# Patient Record
Sex: Female | Born: 1980 | Race: Black or African American | Hispanic: No | Marital: Single | Smoking: Current every day smoker
Health system: Southern US, Community
[De-identification: ages and names within clinical notes are randomized; demographics above are authoritative.]

## PROBLEM LIST (undated history)

## (undated) DIAGNOSIS — I10 Essential (primary) hypertension: Secondary | ICD-10-CM

## (undated) DIAGNOSIS — IMO0002 Reserved for concepts with insufficient information to code with codable children: Secondary | ICD-10-CM

## (undated) DIAGNOSIS — G4730 Sleep apnea, unspecified: Secondary | ICD-10-CM

## (undated) HISTORY — DX: Reserved for concepts with insufficient information to code with codable children: IMO0002

## (undated) HISTORY — DX: Sleep apnea, unspecified: G47.30

## (undated) HISTORY — PX: INNER EAR SURGERY: SHX679

## (undated) HISTORY — PX: LAPAROSCOPIC GASTRIC SLEEVE RESECTION: SHX5895

## (undated) HISTORY — PX: TUBAL LIGATION: SHX77

---

## 2007-01-28 ENCOUNTER — Ambulatory Visit: Payer: Self-pay | Admitting: Obstetrics & Gynecology

## 2007-01-28 ENCOUNTER — Ambulatory Visit (HOSPITAL_COMMUNITY): Admission: RE | Admit: 2007-01-28 | Discharge: 2007-01-28 | Payer: Self-pay | Admitting: Obstetrics and Gynecology

## 2007-01-28 ENCOUNTER — Encounter: Payer: Self-pay | Admitting: Obstetrics and Gynecology

## 2007-02-04 ENCOUNTER — Ambulatory Visit: Payer: Self-pay | Admitting: Obstetrics & Gynecology

## 2007-02-05 ENCOUNTER — Ambulatory Visit: Payer: Self-pay | Admitting: Obstetrics and Gynecology

## 2007-02-12 ENCOUNTER — Ambulatory Visit: Payer: Self-pay | Admitting: Gynecology

## 2007-02-19 ENCOUNTER — Ambulatory Visit: Payer: Self-pay | Admitting: Family Medicine

## 2007-02-24 ENCOUNTER — Ambulatory Visit (HOSPITAL_COMMUNITY): Admission: RE | Admit: 2007-02-24 | Discharge: 2007-02-24 | Payer: Self-pay | Admitting: Obstetrics & Gynecology

## 2007-03-05 ENCOUNTER — Ambulatory Visit: Payer: Self-pay | Admitting: Gynecology

## 2007-03-12 ENCOUNTER — Ambulatory Visit: Payer: Self-pay | Admitting: Family Medicine

## 2007-03-19 ENCOUNTER — Ambulatory Visit: Payer: Self-pay | Admitting: Family Medicine

## 2007-03-26 ENCOUNTER — Ambulatory Visit: Payer: Self-pay | Admitting: Family Medicine

## 2007-03-28 ENCOUNTER — Inpatient Hospital Stay (HOSPITAL_COMMUNITY): Admission: AD | Admit: 2007-03-28 | Discharge: 2007-03-30 | Payer: Self-pay | Admitting: Obstetrics and Gynecology

## 2007-03-28 ENCOUNTER — Ambulatory Visit: Payer: Self-pay | Admitting: Obstetrics & Gynecology

## 2008-01-05 ENCOUNTER — Emergency Department (HOSPITAL_COMMUNITY): Admission: EM | Admit: 2008-01-05 | Discharge: 2008-01-05 | Payer: Self-pay | Admitting: Emergency Medicine

## 2008-08-05 ENCOUNTER — Emergency Department (HOSPITAL_COMMUNITY): Admission: EM | Admit: 2008-08-05 | Discharge: 2008-08-05 | Payer: Self-pay | Admitting: Emergency Medicine

## 2008-08-19 DIAGNOSIS — R87619 Unspecified abnormal cytological findings in specimens from cervix uteri: Secondary | ICD-10-CM

## 2008-08-19 DIAGNOSIS — IMO0002 Reserved for concepts with insufficient information to code with codable children: Secondary | ICD-10-CM

## 2008-08-19 HISTORY — DX: Reserved for concepts with insufficient information to code with codable children: IMO0002

## 2008-08-19 HISTORY — DX: Unspecified abnormal cytological findings in specimens from cervix uteri: R87.619

## 2008-12-21 ENCOUNTER — Other Ambulatory Visit: Admission: RE | Admit: 2008-12-21 | Discharge: 2008-12-21 | Payer: Self-pay | Admitting: Family Medicine

## 2009-05-23 IMAGING — US US OB COMP +14 WK
1 series · 14 of 28 positions shown · non-contrast
Comparison: none

OBSTETRICAL ULTRASOUND:

 This ultrasound exam was performed in the [HOSPITAL] Ultrasound Department.  The OB US report was generated in the AS system, and faxed to the ordering physician.  This report is also available in [REDACTED] PACS.

[Series 1: us ob comp +14 wk · 0.28mm/px · 14 of 58 slices shown]
[im 3/58]
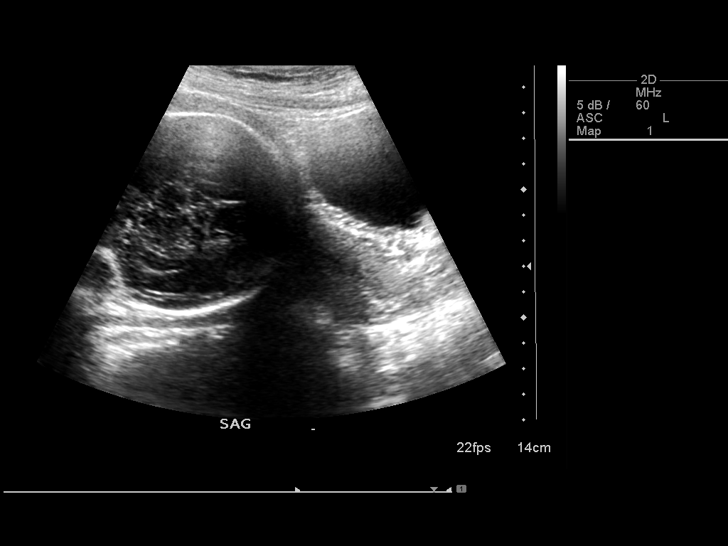
[im 7/58]
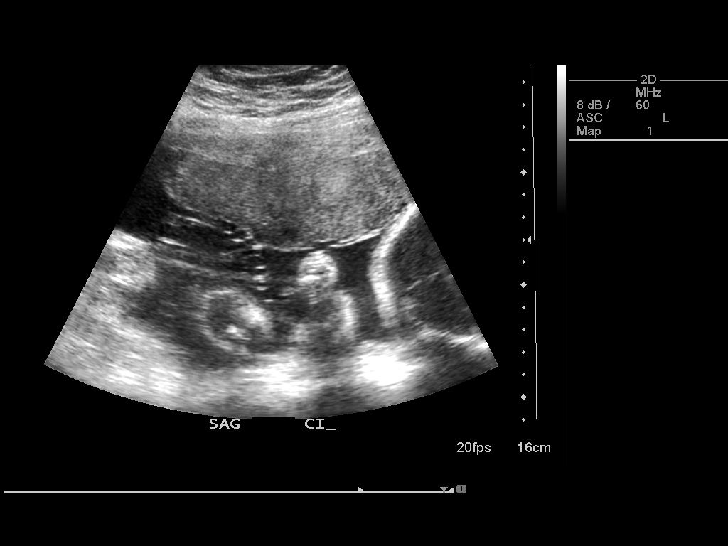
[im 11/58]
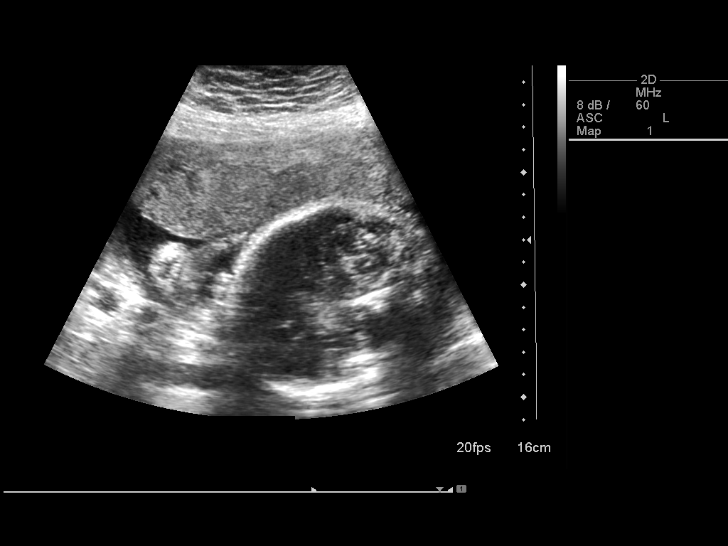
[im 15/58]
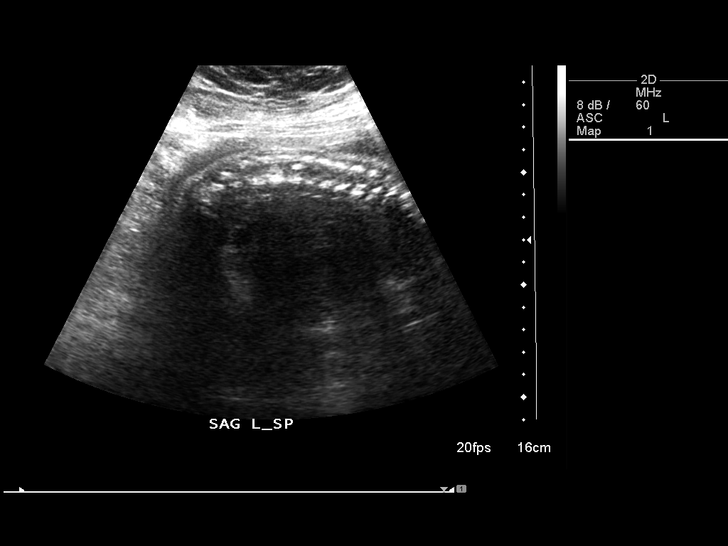
[im 20/58]
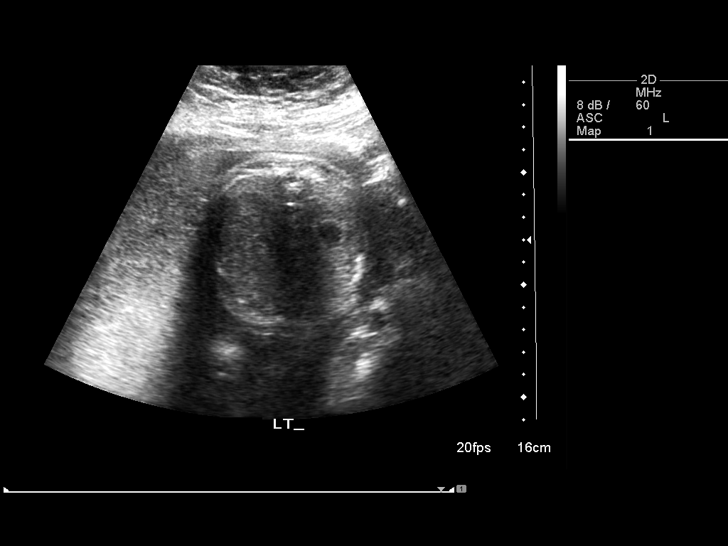
[im 24/58]
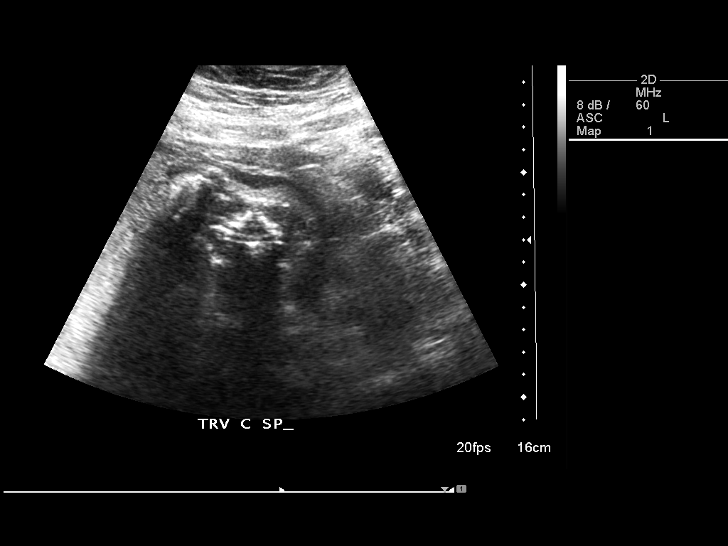
[im 28/58]
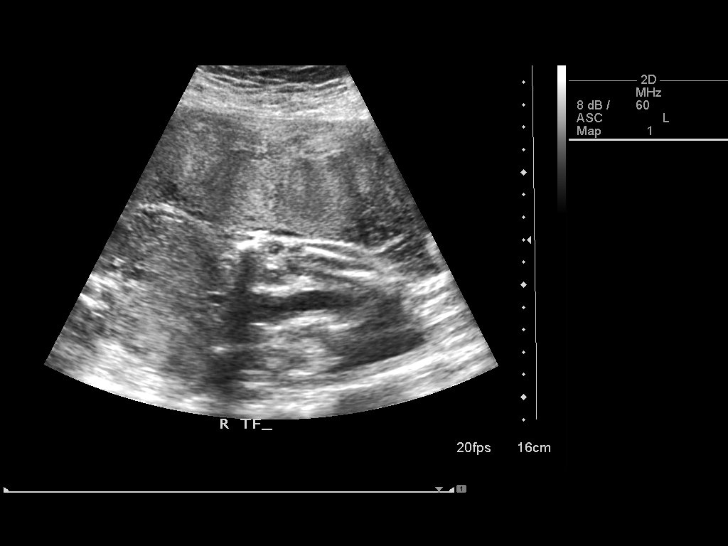
[im 32/58]
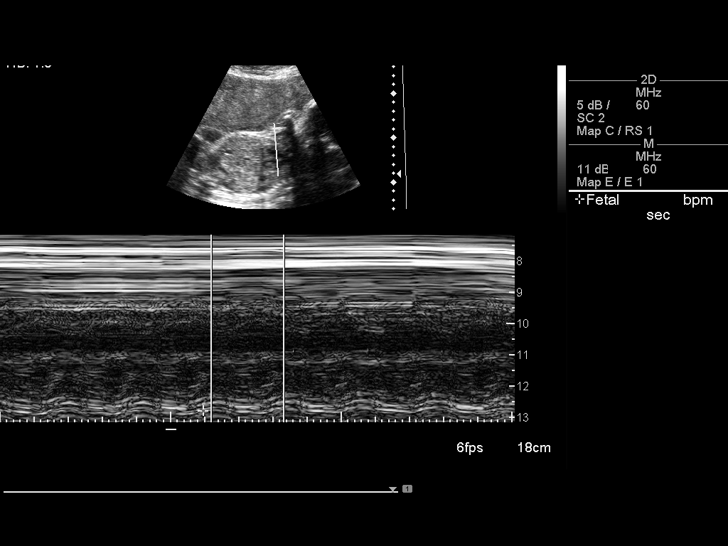
[im 36/58]
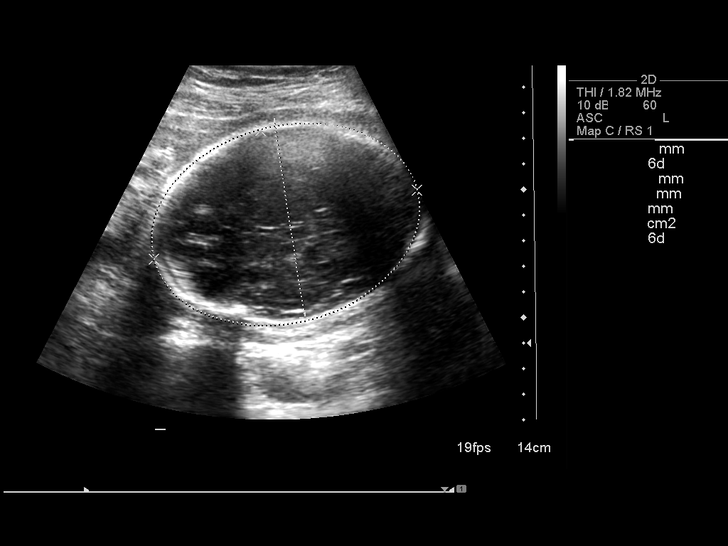
[im 41/58]
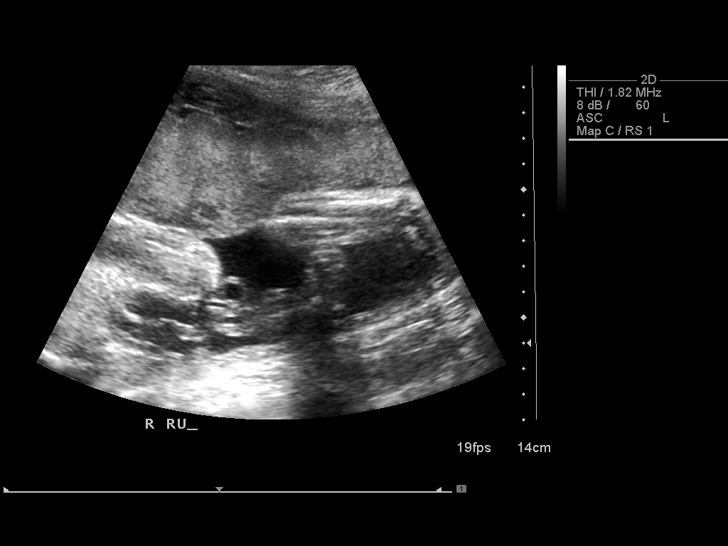
[im 45/58]
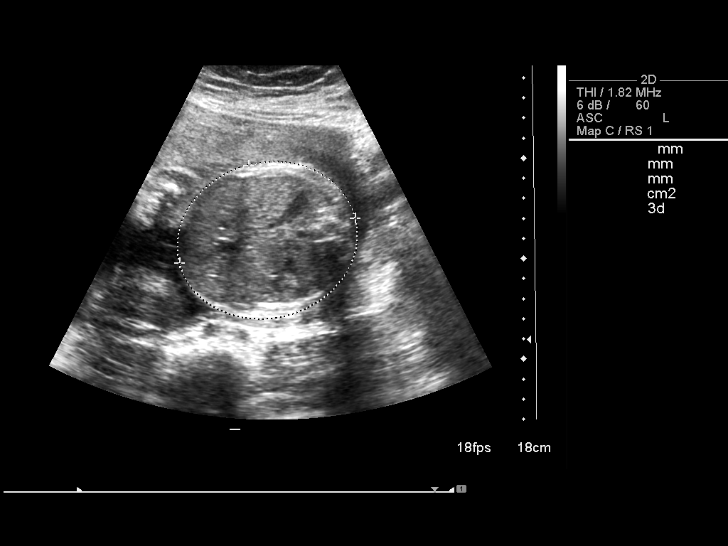
[im 49/58]
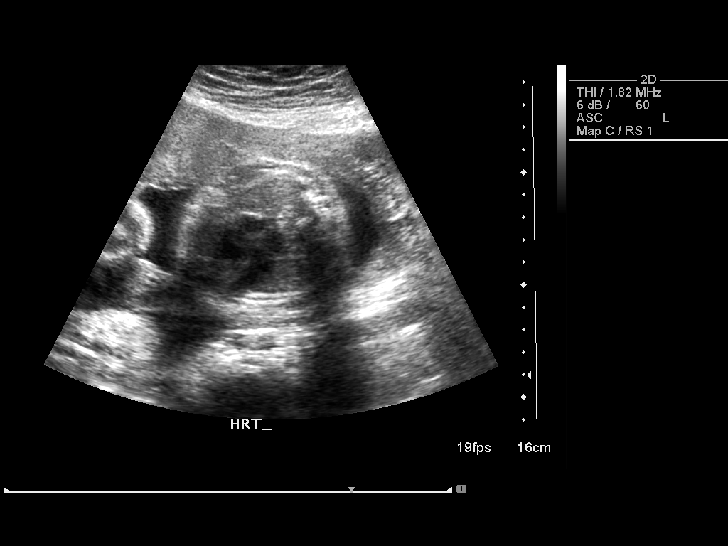
[im 53/58]
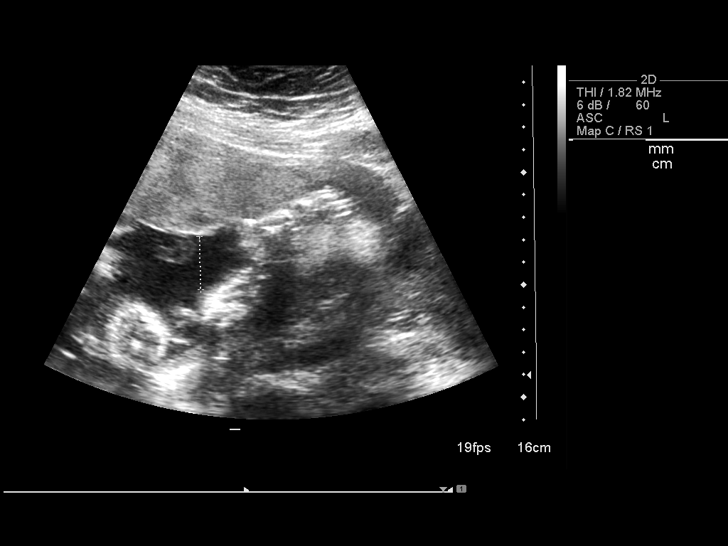
[im 58/58]
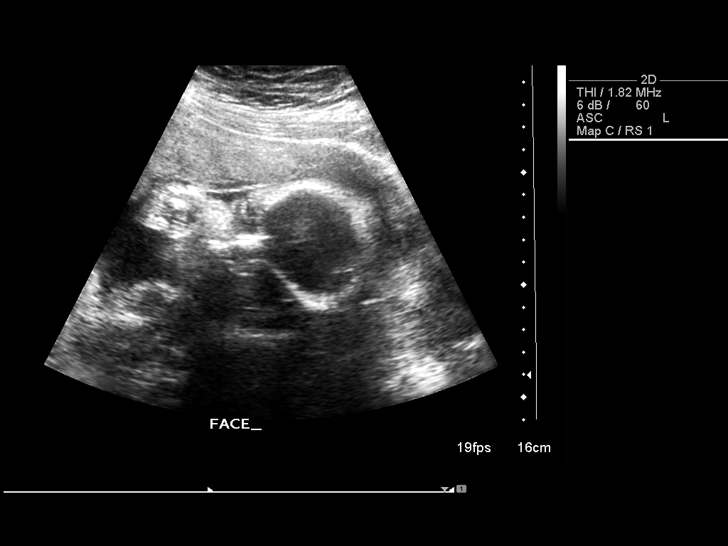

[14 of 28 positions shown; findings below may reference images not displayed]

IMPRESSION: See AS Obstetric US report.

## 2009-09-18 ENCOUNTER — Other Ambulatory Visit: Admission: RE | Admit: 2009-09-18 | Discharge: 2009-09-18 | Payer: Self-pay | Admitting: Obstetrics and Gynecology

## 2010-06-08 ENCOUNTER — Other Ambulatory Visit
Admission: RE | Admit: 2010-06-08 | Discharge: 2010-06-08 | Payer: Self-pay | Source: Home / Self Care | Admitting: Obstetrics and Gynecology

## 2010-09-09 ENCOUNTER — Encounter: Payer: Self-pay | Admitting: Obstetrics and Gynecology

## 2010-12-19 ENCOUNTER — Other Ambulatory Visit: Payer: Self-pay | Admitting: Obstetrics and Gynecology

## 2010-12-19 ENCOUNTER — Other Ambulatory Visit (HOSPITAL_COMMUNITY)
Admission: RE | Admit: 2010-12-19 | Discharge: 2010-12-19 | Disposition: A | Discharge status: Home / Self Care | Payer: Self-pay | Source: Ambulatory Visit | Attending: Obstetrics and Gynecology | Admitting: Obstetrics and Gynecology

## 2010-12-19 DIAGNOSIS — Z01419 Encounter for gynecological examination (general) (routine) without abnormal findings: Secondary | ICD-10-CM | POA: Insufficient documentation

## 2011-06-03 LAB — URINE MICROSCOPIC-ADD ON

## 2011-06-03 LAB — POCT URINALYSIS DIP (DEVICE)
Bilirubin Urine: NEGATIVE
Bilirubin Urine: NEGATIVE
Glucose, UA: NEGATIVE
Hgb urine dipstick: NEGATIVE
Hgb urine dipstick: NEGATIVE
Hgb urine dipstick: NEGATIVE
Hgb urine dipstick: NEGATIVE
Ketones, ur: NEGATIVE
Ketones, ur: NEGATIVE
Ketones, ur: NEGATIVE
Ketones, ur: NEGATIVE
Nitrite: NEGATIVE
Nitrite: NEGATIVE
Operator id: 120861
Protein, ur: NEGATIVE
Protein, ur: NEGATIVE
Specific Gravity, Urine: 1.025
Urobilinogen, UA: 1
pH: 6.5
pH: 6
pH: 6
pH: 6

## 2011-06-03 LAB — COMPREHENSIVE METABOLIC PANEL
Albumin: 2.4 — ABNORMAL LOW
BUN: 5 — ABNORMAL LOW
Chloride: 108
Creatinine, Ser: 0.58
Total Bilirubin: 0.7

## 2011-06-03 LAB — URINALYSIS, MICROSCOPIC ONLY
Bilirubin Urine: NEGATIVE
Glucose, UA: NEGATIVE
Ketones, ur: NEGATIVE
Leukocytes, UA: NEGATIVE
Nitrite: NEGATIVE
Protein, ur: NEGATIVE
Specific Gravity, Urine: 1.01
Urobilinogen, UA: 0.2
WBC, UA: NONE SEEN
pH: 6.5

## 2011-06-03 LAB — URINE CULTURE
Colony Count: NO GROWTH
Culture: NO GROWTH
Special Requests: NEGATIVE

## 2011-06-03 LAB — URINALYSIS, ROUTINE W REFLEX MICROSCOPIC
Bilirubin Urine: NEGATIVE
Glucose, UA: NEGATIVE
Ketones, ur: NEGATIVE
Nitrite: NEGATIVE
Protein, ur: NEGATIVE
Specific Gravity, Urine: 1.02
Urobilinogen, UA: 1
pH: 6

## 2011-06-03 LAB — CBC
HCT: 36.1
HCT: 36.3
Hemoglobin: 12
MCV: 82.7
MCV: 84.2
Platelets: 230
Platelets: 266
RDW: 12.9
RDW: 13.2
WBC: 9

## 2011-06-03 LAB — RPR: RPR Ser Ql: NONREACTIVE

## 2011-06-03 LAB — LACTATE DEHYDROGENASE: LDH: 129

## 2011-06-04 LAB — POCT URINALYSIS DIP (DEVICE)
Bilirubin Urine: NEGATIVE
Hgb urine dipstick: NEGATIVE
Ketones, ur: NEGATIVE
Protein, ur: 30 — AB
Specific Gravity, Urine: 1.025
pH: 6

## 2011-06-05 LAB — POCT URINALYSIS DIP (DEVICE)
Bilirubin Urine: NEGATIVE
Bilirubin Urine: NEGATIVE
Hgb urine dipstick: NEGATIVE
Hgb urine dipstick: NEGATIVE
Ketones, ur: NEGATIVE
Ketones, ur: NEGATIVE
Protein, ur: 30 — AB
Protein, ur: NEGATIVE
Specific Gravity, Urine: 1.025
Specific Gravity, Urine: 1.025
pH: 6
pH: 6.5

## 2011-06-06 LAB — POCT URINALYSIS DIP (DEVICE)
Bilirubin Urine: NEGATIVE
Ketones, ur: NEGATIVE
Protein, ur: 30 — AB
Specific Gravity, Urine: >=1.03
pH: 6

## 2012-10-01 ENCOUNTER — Encounter: Payer: Self-pay | Admitting: Family

## 2012-10-19 ENCOUNTER — Encounter: Payer: Self-pay | Admitting: Obstetrics and Gynecology

## 2012-10-19 ENCOUNTER — Ambulatory Visit (INDEPENDENT_AMBULATORY_CARE_PROVIDER_SITE_OTHER): Payer: Self-pay | Admitting: Obstetrics and Gynecology

## 2012-10-19 VITALS — BP 140/94 | Temp 97.2°F | Ht 65.5 in | Wt >= 6400 oz

## 2012-10-19 DIAGNOSIS — O99213 Obesity complicating pregnancy, third trimester: Secondary | ICD-10-CM

## 2012-10-19 DIAGNOSIS — Z3009 Encounter for other general counseling and advice on contraception: Secondary | ICD-10-CM

## 2012-10-19 DIAGNOSIS — E669 Obesity, unspecified: Secondary | ICD-10-CM

## 2012-10-19 DIAGNOSIS — O0933 Supervision of pregnancy with insufficient antenatal care, third trimester: Secondary | ICD-10-CM

## 2012-10-19 LAB — POCT URINALYSIS DIP (DEVICE)
Glucose, UA: NEGATIVE mg/dL
Hgb urine dipstick: NEGATIVE
Ketones, ur: 15 mg/dL — AB
Protein, ur: NEGATIVE mg/dL
Specific Gravity, Urine: >=1.03 (ref 1.005–1.030)

## 2012-10-19 NOTE — Progress Notes (Signed)
Pulse 97 2nd BP 121/81

## 2012-10-19 NOTE — Progress Notes (Signed)
Nutrition note: 1st visit consult Pt has h/o obesity. Pt has gained 14# @ 110w1d, which is wnl. Pt reports eating 2 meals & 1 snack/d. Pt reports having some nausea and heartburn. Pt is taking a PNV. Pt received verbal & written education on general nutrition during pregnancy. Disc tips to decrease nausea & heartburn. Disc wt gain goals of 11-20# or 0.5#/wk. Pt agrees to continue taking PNV. Pt does not receive WIC but plans to apply. Pt plans to BF. F/u if referred Blondell Reveal, MS, RD, LDN

## 2012-10-19 NOTE — Progress Notes (Signed)
U/S scheduled 10/20/12 at 2 pm.

## 2012-10-19 NOTE — Progress Notes (Signed)
Repeat BP 121/81  Subjective:    Samantha Wilkerson is a [id number] [redacted]w[redacted]d being seen today for her first obstetrical visit.  Her obstetrical history is significant for obesity and late to prenatal care. Patient does intend to breast feed. Pregnancy history fully reviewed. Patient reports uncomplicated previous pregnancies  Patient reports no complaints.  Filed Vitals:   10/19/12 1032 10/19/12 1035  BP: 140/94   Temp: 97.2 F (36.2 C)   Height:  5' 5.5" (1.664 m)  Weight: 414 lb (187.789 kg)     HISTORY: OB History   Grav Para Term Preterm Abortions TAB SAB Ect Mult Living   5 2 2  2  0 0 0 0 2     # Outc Date GA Lbr Len/2nd Wgt Sex Del Anes PTL Lv   1 TRM 10/06    M SVD      2 TRM 8/08    F SVD EPI     3 ABT            4 ABT            5 CUR              Past Medical History  Diagnosis Date  . Abnormal Pap smear 2010    performed a colposcopy   Past Surgical History  Procedure Laterality Date  . Inner ear surgery     Family History  Problem Relation Age of Onset  . Diabetes Mother      Exam    Uterus:     Pelvic Exam:    Perineum: Normal Perineum   Vulva: normal   Vagina:  normal mucosa, normal discharge   pH:    Cervix: FT/Long/post   Adnexa: not evaluated   Bony Pelvis: android  System: Breast:  normal appearance, no masses or tenderness   Skin: normal coloration and turgor, no rashes    Neurologic: oriented, grossly non-focal   Extremities: normal strength, tone, and muscle mass   HEENT extra ocular movement intact   Mouth/Teeth mucous membranes moist, pharynx normal without lesions   Neck supple and no masses   Cardiovascular: regular rate and rhythm   Respiratory:  chest clear, no wheezing, crepitations, rhonchi, normal symmetric air entry   Abdomen: soft, gravid, obese   Urinary:       Assessment:    Pregnancy: [id number] Patient Active Problem List  Diagnosis  . Insufficient prenatal care        Plan:     Initial labs drawn. Prenatal  vitamins. Problem list reviewed and updated. Genetic Screening discussed : too late.  Ultrasound discussed; fetal survey: ordered. Reviewed healthy eating and exercise in pregnancy Patient desires permanent sterilization- will sign papers today  Follow up in 2 weeks. 50% of 30 min visit spent on counseling and coordination of care.     CONSTANT,PEGGY 10/19/2012

## 2012-10-20 ENCOUNTER — Ambulatory Visit (HOSPITAL_COMMUNITY)
Admission: RE | Admit: 2012-10-20 | Discharge: 2012-10-20 | Disposition: A | Discharge status: Home / Self Care | Payer: Medicaid Other | Source: Ambulatory Visit | Attending: Obstetrics and Gynecology | Admitting: Obstetrics and Gynecology

## 2012-10-20 ENCOUNTER — Other Ambulatory Visit: Payer: Self-pay | Admitting: Obstetrics and Gynecology

## 2012-10-20 DIAGNOSIS — Z3689 Encounter for other specified antenatal screening: Secondary | ICD-10-CM | POA: Insufficient documentation

## 2012-10-20 DIAGNOSIS — O0933 Supervision of pregnancy with insufficient antenatal care, third trimester: Secondary | ICD-10-CM

## 2012-10-20 DIAGNOSIS — Z3009 Encounter for other general counseling and advice on contraception: Secondary | ICD-10-CM

## 2012-10-20 DIAGNOSIS — O99213 Obesity complicating pregnancy, third trimester: Secondary | ICD-10-CM

## 2012-10-20 DIAGNOSIS — E669 Obesity, unspecified: Secondary | ICD-10-CM | POA: Insufficient documentation

## 2012-10-20 LAB — CULTURE, OB URINE: Colony Count: 9000

## 2012-10-21 ENCOUNTER — Other Ambulatory Visit: Payer: Self-pay

## 2012-10-21 ENCOUNTER — Encounter: Payer: Self-pay | Admitting: Obstetrics and Gynecology

## 2012-10-22 ENCOUNTER — Other Ambulatory Visit: Payer: Self-pay

## 2012-10-22 ENCOUNTER — Encounter: Payer: Self-pay | Admitting: Obstetrics & Gynecology

## 2012-10-22 LAB — OBSTETRIC PANEL
Antibody Screen: NEGATIVE
Eosinophils Absolute: 0.1 10*3/uL (ref 0.0–0.7)
Eosinophils Relative: 1 % (ref 0–5)
MCHC: 34.9 g/dL (ref 30.0–36.0)
Monocytes Absolute: 0.4 10*3/uL (ref 0.1–1.0)
Neutro Abs: 4.6 10*3/uL (ref 1.7–7.7)
Neutrophils Relative %: 67 % (ref 43–77)
RDW: 14.5 % (ref 11.5–15.5)
Rh Type: POSITIVE
Rubella: 1.36 Index — ABNORMAL HIGH (ref <0.90)

## 2012-10-22 LAB — HIV ANTIBODY (ROUTINE TESTING W REFLEX): HIV: NONREACTIVE

## 2012-10-22 LAB — GLUCOSE TOLERANCE, 1 HOUR (50G) W/O FASTING: Glucose, 1 Hour GTT: 149 mg/dL — ABNORMAL HIGH (ref 70–140)

## 2012-10-24 ENCOUNTER — Encounter: Payer: Self-pay | Admitting: Obstetrics and Gynecology

## 2012-10-26 ENCOUNTER — Telehealth: Payer: Self-pay | Admitting: *Deleted

## 2012-10-26 NOTE — Telephone Encounter (Signed)
Called patient and left her a message to call us back for results and to schedule appt.

## 2012-10-26 NOTE — Telephone Encounter (Signed)
Patient informed. She will come in on Wednesday at 800 for 3 hr gtt.

## 2012-10-26 NOTE — Telephone Encounter (Signed)
Message copied by Mannie Stabile on Mon Oct 26, 2012  9:55 AM ------      Message from: Catalina Antigua      Created: Sat Oct 24, 2012 10:49 AM      Regarding: needs 3 hr gtt       Patient needs to be scheduled for 3 hr GTT            Thanks      Peggy ------

## 2012-10-28 ENCOUNTER — Other Ambulatory Visit: Payer: Self-pay

## 2012-10-28 DIAGNOSIS — R7309 Other abnormal glucose: Secondary | ICD-10-CM

## 2012-10-29 ENCOUNTER — Encounter: Payer: Self-pay | Admitting: Obstetrics and Gynecology

## 2012-10-29 LAB — GLUCOSE TOLERANCE, 3 HOURS
Glucose Tolerance, 2 hour: 102 mg/dL (ref 70–164)
Glucose Tolerance, Fasting: 85 mg/dL (ref 70–104)
Glucose, GTT - 3 Hour: 101 mg/dL (ref 70–144)

## 2012-11-04 ENCOUNTER — Ambulatory Visit (INDEPENDENT_AMBULATORY_CARE_PROVIDER_SITE_OTHER): Payer: Self-pay | Admitting: Advanced Practice Midwife

## 2012-11-04 VITALS — BP 124/80 | Temp 97.3°F | Wt >= 6400 oz

## 2012-11-04 DIAGNOSIS — Z1389 Encounter for screening for other disorder: Secondary | ICD-10-CM

## 2012-11-04 LAB — POCT URINALYSIS DIP (DEVICE)
Bilirubin Urine: NEGATIVE
Hgb urine dipstick: NEGATIVE
Ketones, ur: NEGATIVE mg/dL
Protein, ur: 30 mg/dL — AB
pH: 6.5 (ref 5.0–8.0)

## 2012-11-04 NOTE — Patient Instructions (Signed)
Pregnancy - Third Trimester  The third trimester of pregnancy (the last 3 months) is a period of the most rapid growth for you and your baby. The baby approaches a length of 20 inches and a weight of 6 to 10 pounds. The baby is adding on fat and getting ready for life outside your body. While inside, babies have periods of sleeping and waking, suck their thumbs, and hiccups. You can often feel small contractions of the uterus. This is false labor. It is also called Braxton-Hicks contractions. This is like a practice for labor. The usual problems in this stage of pregnancy include more difficulty breathing, swelling of the hands and feet from water retention, and having to urinate more often because of the uterus and baby pressing on your bladder.   PRENATAL EXAMS  · Blood work may continue to be done during prenatal exams. These tests are done to check on your health and the probable health of your baby. Blood work is used to follow your blood levels (hemoglobin). Anemia (low hemoglobin) is common during pregnancy. Iron and vitamins are given to help prevent this. You may also continue to be checked for diabetes. Some of the past blood tests may be done again.  · The size of the uterus is measured during each visit. This makes sure your baby is growing properly according to your pregnancy dates.  · Your blood pressure is checked every prenatal visit. This is to make sure you are not getting toxemia.  · Your urine is checked every prenatal visit for infection, diabetes and protein.  · Your weight is checked at each visit. This is done to make sure gains are happening at the suggested rate and that you and your baby are growing normally.  · Sometimes, an ultrasound is performed to confirm the position and the proper growth and development of the baby. This is a test done that bounces harmless sound waves off the baby so your caregiver can more accurately determine due dates.  · Discuss the type of pain medication and  anesthesia you will have during your labor and delivery.  · Discuss the possibility and anesthesia if a Cesarean Section might be necessary.  · Inform your caregiver if there is any mental or physical violence at home.  Sometimes, a specialized non-stress test, contraction stress test and biophysical profile are done to make sure the baby is not having a problem. Checking the amniotic fluid surrounding the baby is called an amniocentesis. The amniotic fluid is removed by sticking a needle into the belly (abdomen). This is sometimes done near the end of pregnancy if an early delivery is required. In this case, it is done to help make sure the baby's lungs are mature enough for the baby to live outside of the womb. If the lungs are not mature and it is unsafe to deliver the baby, an injection of cortisone medication is given to the mother 1 to 2 days before the delivery. This helps the baby's lungs mature and makes it safer to deliver the baby.  CHANGES OCCURING IN THE THIRD TRIMESTER OF PREGNANCY  Your body goes through many changes during pregnancy. They vary from person to person. Talk to your caregiver about changes you notice and are concerned about.  · During the last trimester, you have probably had an increase in your appetite. It is normal to have cravings for certain foods. This varies from person to person and pregnancy to pregnancy.  · You may begin to   get stretch marks on your hips, abdomen, and breasts. These are normal changes in the body during pregnancy. There are no exercises or medications to take which prevent this change.  · Constipation may be treated with a stool softener or adding bulk to your diet. Drinking lots of fluids, fiber in vegetables, fruits, and whole grains are helpful.  · Exercising is also helpful. If you have been very active up until your pregnancy, most of these activities can be continued during your pregnancy. If you have been less active, it is helpful to start an exercise  program such as walking. Consult your caregiver before starting exercise programs.  · Avoid all smoking, alcohol, un-prescribed drugs, herbs and "street drugs" during your pregnancy. These chemicals affect the formation and growth of the baby. Avoid chemicals throughout the pregnancy to ensure the delivery of a healthy infant.  · Backache, varicose veins and hemorrhoids may develop or get worse.  · You will tire more easily in the third trimester, which is normal.  · The baby's movements may be stronger and more often.  · You may become short of breath easily.  · Your belly button may stick out.  · A yellow discharge may leak from your breasts called colostrum.  · You may have a bloody mucus discharge. This usually occurs a few days to a week before labor begins.  HOME CARE INSTRUCTIONS   · Keep your caregiver's appointments. Follow your caregiver's instructions regarding medication use, exercise, and diet.  · During pregnancy, you are providing food for you and your baby. Continue to eat regular, well-balanced meals. Choose foods such as meat, fish, milk and other low fat dairy products, vegetables, fruits, and whole-grain breads and cereals. Your caregiver will tell you of the ideal weight gain.  · A physical sexual relationship may be continued throughout pregnancy if there are no other problems such as early (premature) leaking of amniotic fluid from the membranes, vaginal bleeding, or belly (abdominal) pain.  · Exercise regularly if there are no restrictions. Check with your caregiver if you are unsure of the safety of your exercises. Greater weight gain will occur in the last 2 trimesters of pregnancy. Exercising helps:  · Control your weight.  · Get you in shape for labor and delivery.  · You lose weight after you deliver.  · Rest a lot with legs elevated, or as needed for leg cramps or low back pain.  · Wear a good support or jogging bra for breast tenderness during pregnancy. This may help if worn during  sleep. Pads or tissues may be used in the bra if you are leaking colostrum.  · Do not use hot tubs, steam rooms, or saunas.  · Wear your seat belt when driving. This protects you and your baby if you are in an accident.  · Avoid raw meat, cat litter boxes and soil used by cats. These carry germs that can cause birth defects in the baby.  · It is easier to loose urine during pregnancy. Tightening up and strengthening the pelvic muscles will help with this problem. You can practice stopping your urination while you are going to the bathroom. These are the same muscles you need to strengthen. It is also the muscles you would use if you were trying to stop from passing gas. You can practice tightening these muscles up 10 times a set and repeating this about 3 times per day. Once you know what muscles to tighten up, do not perform these   exercises during urination. It is more likely to cause an infection by backing up the urine.  · Ask for help if you have financial, counseling or nutritional needs during pregnancy. Your caregiver will be able to offer counseling for these needs as well as refer you for other special needs.  · Make a list of emergency phone numbers and have them available.  · Plan on getting help from family or friends when you go home from the hospital.  · Make a trial run to the hospital.  · Take prenatal classes with the father to understand, practice and ask questions about the labor and delivery.  · Prepare the baby's room/nursery.  · Do not travel out of the city unless it is absolutely necessary and with the advice of your caregiver.  · Wear only low or no heal shoes to have better balance and prevent falling.  MEDICATIONS AND DRUG USE IN PREGNANCY  · Take prenatal vitamins as directed. The vitamin should contain 1 milligram of folic acid. Keep all vitamins out of reach of children. Only a couple vitamins or tablets containing iron may be fatal to a baby or young child when ingested.  · Avoid use  of all medications, including herbs, over-the-counter medications, not prescribed or suggested by your caregiver. Only take over-the-counter or prescription medicines for pain, discomfort, or fever as directed by your caregiver. Do not use aspirin, ibuprofen (Motrin®, Advil®, Nuprin®) or naproxen (Aleve®) unless OK'd by your caregiver.  · Let your caregiver also know about herbs you may be using.  · Alcohol is related to a number of birth defects. This includes fetal alcohol syndrome. All alcohol, in any form, should be avoided completely. Smoking will cause low birth rate and premature babies.  · Street/illegal drugs are very harmful to the baby. They are absolutely forbidden. A baby born to an addicted mother will be addicted at birth. The baby will go through the same withdrawal an adult does.  SEEK MEDICAL CARE IF:  You have any concerns or worries during your pregnancy. It is better to call with your questions if you feel they cannot wait, rather than worry about them.  DECISIONS ABOUT CIRCUMCISION  You may or may not know the sex of your baby. If you know your baby is a boy, it may be time to think about circumcision. Circumcision is the removal of the foreskin of the penis. This is the skin that covers the sensitive end of the penis. There is no proven medical need for this. Often this decision is made on what is popular at the time or based upon religious beliefs and social issues. You can discuss these issues with your caregiver or pediatrician.  SEEK IMMEDIATE MEDICAL CARE IF:   · An unexplained oral temperature above 102° F (38.9° C) develops, or as your caregiver suggests.  · You have leaking of fluid from the vagina (birth canal). If leaking membranes are suspected, take your temperature and tell your caregiver of this when you call.  · There is vaginal spotting, bleeding or passing clots. Tell your caregiver of the amount and how many pads are used.  · You develop a bad smelling vaginal discharge with  a change in the color from clear to white.  · You develop vomiting that lasts more than 24 hours.  · You develop chills or fever.  · You develop shortness of breath.  · You develop burning on urination.  · You loose more than 2 pounds of weight   or gain more than 2 pounds of weight or as suggested by your caregiver.  · You notice sudden swelling of your face, hands, and feet or legs.  · You develop belly (abdominal) pain. Round ligament discomfort is a common non-cancerous (benign) cause of abdominal pain in pregnancy. Your caregiver still must evaluate you.  · You develop a severe headache that does not go away.  · You develop visual problems, blurred or double vision.  · If you have not felt your baby move for more than 1 hour. If you think the baby is not moving as much as usual, eat something with sugar in it and lie down on your left side for an hour. The baby should move at least 4 to 5 times per hour. Call right away if your baby moves less than that.  · You fall, are in a car accident or any kind of trauma.  · There is mental or physical violence at home.  Document Released: 07/30/2001 Document Revised: 10/28/2011 Document Reviewed: 02/01/2009  [employer]® Patient Information ©2013 [employer].

## 2012-11-04 NOTE — Progress Notes (Signed)
Had normal 3 hour GTT except FBS was 101 (should be 95), but other values normal   Feels well. Baby moving. US done on 3/4 > SIUP, 64%ile, normal anatomy

## 2012-11-18 ENCOUNTER — Ambulatory Visit (INDEPENDENT_AMBULATORY_CARE_PROVIDER_SITE_OTHER): Payer: Self-pay | Admitting: Obstetrics and Gynecology

## 2012-11-18 ENCOUNTER — Other Ambulatory Visit: Payer: Self-pay | Admitting: Obstetrics and Gynecology

## 2012-11-18 VITALS — BP 139/85 | Wt >= 6400 oz

## 2012-11-18 DIAGNOSIS — E669 Obesity, unspecified: Secondary | ICD-10-CM

## 2012-11-18 DIAGNOSIS — O99213 Obesity complicating pregnancy, third trimester: Secondary | ICD-10-CM

## 2012-11-18 LAB — POCT URINALYSIS DIP (DEVICE)
Ketones, ur: NEGATIVE mg/dL
Protein, ur: 30 mg/dL — AB
Specific Gravity, Urine: >=1.03 (ref 1.005–1.030)
Urobilinogen, UA: 0.2 mg/dL (ref 0.0–1.0)
pH: 6 (ref 5.0–8.0)

## 2012-11-18 NOTE — Progress Notes (Signed)
Pulse: 92

## 2012-11-18 NOTE — Addendum Note (Signed)
Addended by: Kathee Delton on: 11/18/2012 12:16 PM   Modules accepted: Orders

## 2012-11-18 NOTE — Patient Instructions (Signed)
Fetal Movement Counts Patient Name: __________________________________________________ Patient Due Date: ____________________ Kick counts is highly recommended in high risk pregnancies, but it is a good idea for every pregnant woman to do. Start counting fetal movements at 28 weeks of the pregnancy. Fetal movements increase after eating a full meal or eating or drinking something sweet (the blood sugar is higher). It is also important to drink plenty of fluids (well hydrated) before doing the count. Lie on your left side because it helps with the circulation or you can sit in a comfortable chair with your arms over your belly (abdomen) with no distractions around you. DOING THE COUNT  Try to do the count the same time of day each time you do it.  Mark the day and time, then see how long it takes for you to feel 10 movements (kicks, flutters, swishes, rolls). You should have at least 10 movements within 2 hours. You will most likely feel 10 movements in much less than 2 hours. If you do not, wait an hour and count again. After a couple of days you will see a pattern.  What you are looking for is a change in the pattern or not enough counts in 2 hours. Is it taking longer in time to reach 10 movements? SEEK MEDICAL CARE IF:  You feel less than 10 counts in 2 hours. Tried twice.  No movement in one hour.  The pattern is changing or taking longer each day to reach 10 counts in 2 hours.  You feel the baby is not moving as it usually does. Date: ____________ Movements: ____________ Start time: ____________ Finish time: ____________  Date: ____________ Movements: ____________ Start time: ____________ Finish time: ____________ Date: ____________ Movements: ____________ Start time: ____________ Finish time: ____________ Date: ____________ Movements: ____________ Start time: ____________ Finish time: ____________ Date: ____________ Movements: ____________ Start time: ____________ Finish time:  ____________ Date: ____________ Movements: ____________ Start time: ____________ Finish time: ____________ Date: ____________ Movements: ____________ Start time: ____________ Finish time: ____________ Date: ____________ Movements: ____________ Start time: ____________ Finish time: ____________  Date: ____________ Movements: ____________ Start time: ____________ Finish time: ____________ Date: ____________ Movements: ____________ Start time: ____________ Finish time: ____________ Date: ____________ Movements: ____________ Start time: ____________ Finish time: ____________ Date: ____________ Movements: ____________ Start time: ____________ Finish time: ____________ Date: ____________ Movements: ____________ Start time: ____________ Finish time: ____________ Date: ____________ Movements: ____________ Start time: ____________ Finish time: ____________ Date: ____________ Movements: ____________ Start time: ____________ Finish time: ____________  Date: ____________ Movements: ____________ Start time: ____________ Finish time: ____________ Date: ____________ Movements: ____________ Start time: ____________ Finish time: ____________ Date: ____________ Movements: ____________ Start time: ____________ Finish time: ____________ Date: ____________ Movements: ____________ Start time: ____________ Finish time: ____________ Date: ____________ Movements: ____________ Start time: ____________ Finish time: ____________ Date: ____________ Movements: ____________ Start time: ____________ Finish time: ____________ Date: ____________ Movements: ____________ Start time: ____________ Finish time: ____________  Date: ____________ Movements: ____________ Start time: ____________ Finish time: ____________ Date: ____________ Movements: ____________ Start time: ____________ Finish time: ____________ Date: ____________ Movements: ____________ Start time: ____________ Finish time: ____________ Date: ____________ Movements:  ____________ Start time: ____________ Finish time: ____________ Date: ____________ Movements: ____________ Start time: ____________ Finish time: ____________ Date: ____________ Movements: ____________ Start time: ____________ Finish time: ____________ Date: ____________ Movements: ____________ Start time: ____________ Finish time: ____________  Date: ____________ Movements: ____________ Start time: ____________ Finish time: ____________ Date: ____________ Movements: ____________ Start time: ____________ Finish time: ____________ Date: ____________ Movements: ____________ Start time: ____________ Finish time: ____________ Date: ____________ Movements:   ____________ Start time: ____________ Finish time: ____________ Date: ____________ Movements: ____________ Start time: ____________ Finish time: ____________ Date: ____________ Movements: ____________ Start time: ____________ Finish time: ____________ Date: ____________ Movements: ____________ Start time: ____________ Finish time: ____________  Date: ____________ Movements: ____________ Start time: ____________ Finish time: ____________ Date: ____________ Movements: ____________ Start time: ____________ Finish time: ____________ Date: ____________ Movements: ____________ Start time: ____________ Finish time: ____________ Date: ____________ Movements: ____________ Start time: ____________ Finish time: ____________ Date: ____________ Movements: ____________ Start time: ____________ Finish time: ____________ Date: ____________ Movements: ____________ Start time: ____________ Finish time: ____________ Date: ____________ Movements: ____________ Start time: ____________ Finish time: ____________  Date: ____________ Movements: ____________ Start time: ____________ Finish time: ____________ Date: ____________ Movements: ____________ Start time: ____________ Finish time: ____________ Date: ____________ Movements: ____________ Start time: ____________ Finish  time: ____________ Date: ____________ Movements: ____________ Start time: ____________ Finish time: ____________ Date: ____________ Movements: ____________ Start time: ____________ Finish time: ____________ Date: ____________ Movements: ____________ Start time: ____________ Finish time: ____________ Date: ____________ Movements: ____________ Start time: ____________ Finish time: ____________  Date: ____________ Movements: ____________ Start time: ____________ Finish time: ____________ Date: ____________ Movements: ____________ Start time: ____________ Finish time: ____________ Date: ____________ Movements: ____________ Start time: ____________ Finish time: ____________ Date: ____________ Movements: ____________ Start time: ____________ Finish time: ____________ Date: ____________ Movements: ____________ Start time: ____________ Finish time: ____________ Date: ____________ Movements: ____________ Start time: ____________ Finish time: ____________ Document Released: 09/04/2006 Document Revised: 10/28/2011 Document Reviewed: 03/07/2009 [employer] Patient Information 2013 [employer].  

## 2012-11-18 NOTE — Progress Notes (Signed)
Doing well. [place] at 32 wks 64th%ile, AFI 11. Hx neg for [facility]. Watch BP. Plans to breast feed 4 months at least. GBS done.

## 2012-11-30 ENCOUNTER — Other Ambulatory Visit: Payer: Self-pay | Admitting: Family Medicine

## 2012-11-30 ENCOUNTER — Ambulatory Visit (INDEPENDENT_AMBULATORY_CARE_PROVIDER_SITE_OTHER): Payer: Self-pay | Admitting: Family Medicine

## 2012-11-30 VITALS — BP 140/95 | Temp 97.0°F | Wt >= 6400 oz

## 2012-11-30 DIAGNOSIS — O0933 Supervision of pregnancy with insufficient antenatal care, third trimester: Secondary | ICD-10-CM

## 2012-11-30 DIAGNOSIS — O133 Gestational [pregnancy-induced] hypertension without significant proteinuria, third trimester: Secondary | ICD-10-CM

## 2012-11-30 DIAGNOSIS — O0930 Supervision of pregnancy with insufficient antenatal care, unspecified trimester: Secondary | ICD-10-CM

## 2012-11-30 DIAGNOSIS — O139 Gestational [pregnancy-induced] hypertension without significant proteinuria, unspecified trimester: Secondary | ICD-10-CM

## 2012-11-30 LAB — CBC
HCT: 33.3 % — ABNORMAL LOW (ref 36.0–46.0)
Hemoglobin: 11.5 g/dL — ABNORMAL LOW (ref 12.0–15.0)
MCH: 26.6 pg (ref 26.0–34.0)
MCHC: 34.5 g/dL (ref 30.0–36.0)
MCV: 76.9 fL — ABNORMAL LOW (ref 78.0–100.0)

## 2012-11-30 LAB — COMPREHENSIVE METABOLIC PANEL
Alkaline Phosphatase: 100 U/L (ref 39–117)
BUN: 8 mg/dL (ref 6–23)
CO2: 19 mEq/L (ref 19–32)
Creat: 0.61 mg/dL (ref 0.50–1.10)
Glucose, Bld: 88 mg/dL (ref 70–99)
Sodium: 134 mEq/L — ABNORMAL LOW (ref 135–145)
Total Bilirubin: 0.3 mg/dL (ref 0.3–1.2)

## 2012-11-30 LAB — PROTEIN / CREATININE RATIO, URINE: Total Protein, Urine: 20 mg/dL

## 2012-11-30 NOTE — Progress Notes (Signed)
BP in 140s/90s x 2, had elevated pressure 2 weeks ago as well. CMP, CBC, urine prot:creat ordered stat today. Had mild headache last week but not now. No vision changes or RUQ pain. Discussed possible need for induction pending results.

## 2012-11-30 NOTE — Patient Instructions (Addendum)
Normal Labor and Delivery Your caregiver must first be sure you are in labor. Signs of labor include:  You may pass what is called "the mucus plug" before labor begins. This is a small amount of blood stained mucus.  Regular uterine contractions.  The time between contractions get closer together.  The discomfort and pain gradually gets more intense.  Pains are mostly located in the back.  Pains get worse when walking.  The cervix (the opening of the uterus becomes thinner (begins to efface) and opens up (dilates). Once you are in labor and admitted into the hospital or care center, your caregiver will do the following:  A complete physical examination.  Check your vital signs (blood pressure, pulse, temperature and the fetal heart rate).  Do a vaginal examination (using a sterile glove and lubricant) to determine:  The position (presentation) of the baby (head [vertex] or buttock first).  The level (station) of the baby's head in the birth canal.  The effacement and dilatation of the cervix.  You may have your pubic hair shaved and be given an enema depending on your caregiver and the circumstance.  An electronic monitor is usually placed on your abdomen. The monitor follows the length and intensity of the contractions, as well as the baby's heart rate.  Usually, your caregiver will insert an IV in your arm with a bottle of sugar water. This is done as a precaution so that medications can be given to you quickly during labor or delivery. NORMAL LABOR AND DELIVERY IS DIVIDED UP INTO 3 STAGES: First Stage This is when regular contractions begin and the cervix begins to efface and dilate. This stage can last from 3 to 15 hours. The end of the first stage is when the cervix is 100% effaced and 10 centimeters dilated. Pain medications may be given by   Injection (morphine, demerol, etc.)  Regional anesthesia (spinal, caudal or epidural, anesthetics given in different locations of  the spine). Paracervical pain medication may be given, which is an injection of and anesthetic on each side of the cervix. A pregnant woman may request to have "Natural Childbirth" which is not to have any medications or anesthesia during her labor and delivery. Second Stage This is when the baby comes down through the birth canal (vagina) and is born. This can take 1 to 4 hours. As the baby's head comes down through the birth canal, you may feel like you are going to have a bowel movement. You will get the urge to bear down and push until the baby is delivered. As the baby's head is being delivered, the caregiver will decide if an episiotomy (a cut in the perineum and vagina area) is needed to prevent tearing of the tissue in this area. The episiotomy is sewn up after the delivery of the baby and placenta. Sometimes a mask with nitrous oxide is given for the mother to breath during the delivery of the baby to help if there is too much pain. The end of Stage 2 is when the baby is fully delivered. Then when the umbilical cord stops pulsating it is clamped and cut. Third Stage The third stage begins after the baby is completely delivered and ends after the placenta (afterbirth) is delivered. This usually takes 5 to 30 minutes. After the placenta is delivered, a medication is given either by intravenous or injection to help contract the uterus and prevent bleeding. The third stage is not painful and pain medication is usually not necessary.  If an episiotomy was done, it is repaired at this time. After the delivery, the mother is watched and monitored closely for 1 to 2 hours to make sure there is no postpartum bleeding (hemorrhage). If there is a lot of bleeding, medication is given to contract the uterus and stop the bleeding. Document Released: 05/14/2008 Document Revised: 10/28/2011 Document Reviewed: 05/14/2008 [facility] Patient Information 2013 [place], [place].   Hypertension During  Pregnancy Hypertension is also called high blood pressure. It can occur at any time in life and during pregnancy. When you have hypertension, there is extra pressure inside your blood vessels that carry blood from the heart to the rest of your body (arteries). Hypertension during pregnancy can cause problems for you and your baby. Your baby might not weigh as much as it should at birth or might be born early (premature). Very bad cases of hypertension during pregnancy can be life-threatening.  There are different types of hypertension during pregnancy.   Chronic hypertension. This happens when a woman has hypertension before pregnancy and it continues during pregnancy.  Gestational hypertension. This is when hypertension develops during pregnancy.  Preeclampsia or toxemia of pregnancy. This is a very serious type of hypertension that develops only during pregnancy. It is a disease that affects the whole body (systemic) and can be very dangerous for both mother and baby.  Gestational hypertension and preeclampsia usually go away after your baby is born. Blood pressure generally stabilizes within 6 weeks. Women who have hypertension during pregnancy have a greater chance of developing hypertension later in life or with future pregnancies. UNDERSTANDING BLOOD PRESSURE Blood pressure moves blood in your body. Sometimes, the force that moves the blood becomes too strong.  A blood pressure reading is given in 2 numbers and looks like a fraction.  The top number is called the systolic pressure. When your heart beats, it forces more blood to flow through the arteries. Pressure inside the arteries goes up.  The bottom number is the diastolic pressure. Pressure goes down between beats. That is when the heart is resting.  You may have hypertension if:  Your systolic blood pressure is above 140.  Your diastolic pressure is above 90. RISK FACTORS Some factors make you more likely to develop hypertension  during pregnancy. Risk factors include:  Having hypertension before pregnancy.  Having hypertension during a previous pregnancy.  Being overweight.  Being older than 40.  Being pregnant with more than 1 baby (multiples).  Having diabetes or kidney problems. SYMPTOMS Chronic and gestational hypertension may not cause symptoms. Preeclampsia has symptoms, which may include:  Increased protein in your urine. Your caregiver will check for this at every prenatal visit.  Swelling of your hands and face.  Rapid weight gain.  Headaches.  Visual changes.  Being bothered by light.  Abdominal pain, especially in the right upper area.  Chest pain.  Shortness of breath.  Increased reflexes.  Seizures. Seizures occur with a more severe form of preeclampsia, called eclampsia. DIAGNOSIS   You may be diagnosed with hypertension during pregnancy during a regular prenatal exam. At each visit, tests may include:  Blood pressure checks.  A urine test to check for protein in your urine.  The type of hypertension you are diagnosed with depends on when you developed it. It also depends on your specific blood pressure reading.  Developing hypertension before 20 weeks of pregnancy is consistent with chronic hypertension.  Developing hypertension after 20 weeks of pregnancy is consistent with gestational hypertension.  Hypertension with increased urinary protein is diagnosed as preeclampsia.  Blood pressure measurements that stay above 160 systolic or 110 diastolic are a sign of severe preeclampsia. TREATMENT Treatment for hypertension during pregnancy varies. Treatment depends on the type of hypertension and how serious it is.  If you take medicine for chronic hypertension, you may need to switch medicines.  Drugs called ACE inhibitors should not be taken during pregnancy.  Low-dose aspirin may be suggested for women who have risk factors for preeclampsia.  If you have gestational  hypertension, you may need to take a blood pressure medicine that is safe during pregnancy. Your caregiver will recommend the appropriate medicine.  If you have severe preeclampsia, you may need to be in the hospital. Caregivers will watch you and the baby very closely. You also may need to take medicine (magnesium sulfate) to prevent seizures and lower blood pressure.  Sometimes an early delivery is needed. This may be the case if the condition worsens. It would be done to protect you and the baby. The only cure for preeclampsia is delivery. HOME CARE INSTRUCTIONS  Schedule and keep all of your regular prenatal care.  Follow your caregiver's instructions for taking medicines. Tell your caregiver about all medicines you take. This includes over-the-counter medicines.  Eat as little salt as possible.  Get regular exercise.  Do not drink alcohol.  Do not use tobacco products.  Do not drink products with caffeine.  Lie on your left side when resting.  Tell your doctor if you have any preeclampsia symptoms. SEEK IMMEDIATE MEDICAL CARE IF:  You have severe abdominal pain.  You have sudden swelling in the hands, ankles, or face.  You gain 4 pounds (1.8 kg) or more in 1 week.  You vomit repeatedly.  You have vaginal bleeding.  You do not feel the baby moving as much.  You have a headache.  You have blurred or double vision.  You have muscle twitching or spasms.  You have shortness of breath.  You have blue fingernails and lips.  You have blood in your urine. MAKE SURE YOU:  Understand these instructions.  Will watch your condition.  Will get help right away if you are not doing well. Document Released: 04/23/2011 Document Revised: 10/28/2011 Document Reviewed: 04/23/2011 [facility] Patient Information 2013 [place], [place].

## 2012-12-01 ENCOUNTER — Telehealth: Payer: Self-pay | Admitting: *Deleted

## 2012-12-01 NOTE — Telephone Encounter (Signed)
Pt left message requesting test results from yesterday. She then called front desk and spoke w/Antoinette Clinton. She stated that she was told she might need admission for IOL if labs were abnormal. I reviewed chart and labs with Ralene Bathe LPN. All pre-eclampsia labs were wnl. Message was given to pt by Antoinette that her labs were normal and did not show cause for admission at this time. Pt was asked how she felt and she responded, "I feel fine". Pt was advised to go to [facility] for sx of pre-eclampsia (as previously discussed at clinic appt) or to call clinic.  Pt to keep next appt as scheduled on 12/07/12. Pt voiced understanding.

## 2012-12-06 ENCOUNTER — Telehealth: Payer: Self-pay | Admitting: Family Medicine

## 2012-12-06 ENCOUNTER — Other Ambulatory Visit: Payer: Self-pay | Admitting: Family Medicine

## 2012-12-06 NOTE — Telephone Encounter (Signed)
Pt with gestational hypertension. 3 blood pressures over 140/90 in past month. Protein/creatinine ratio and CMP/CBC negative. Tried to reach pt to call her for induction but no answer. Has appt on 4/21 (tomorrow). Will likely set up induction for tomorrow.

## 2012-12-07 ENCOUNTER — Encounter (HOSPITAL_COMMUNITY): Payer: Self-pay | Admitting: *Deleted

## 2012-12-07 ENCOUNTER — Inpatient Hospital Stay (HOSPITAL_COMMUNITY): Payer: Medicaid Other | Admitting: Anesthesiology

## 2012-12-07 ENCOUNTER — Encounter (HOSPITAL_COMMUNITY)
Admission: RE | Disposition: A | Discharge status: Home / Self Care | Payer: Self-pay | Source: Ambulatory Visit | Attending: Family Medicine

## 2012-12-07 ENCOUNTER — Encounter (HOSPITAL_COMMUNITY): Payer: Self-pay

## 2012-12-07 ENCOUNTER — Encounter: Payer: Self-pay | Admitting: Obstetrics and Gynecology

## 2012-12-07 ENCOUNTER — Telehealth (HOSPITAL_COMMUNITY): Payer: Self-pay | Admitting: *Deleted

## 2012-12-07 ENCOUNTER — Ambulatory Visit (INDEPENDENT_AMBULATORY_CARE_PROVIDER_SITE_OTHER): Payer: Self-pay | Admitting: Obstetrics and Gynecology

## 2012-12-07 ENCOUNTER — Other Ambulatory Visit: Payer: Self-pay | Admitting: Obstetrics and Gynecology

## 2012-12-07 ENCOUNTER — Encounter (HOSPITAL_COMMUNITY): Payer: Self-pay | Admitting: Anesthesiology

## 2012-12-07 ENCOUNTER — Inpatient Hospital Stay (HOSPITAL_COMMUNITY)
Admission: RE | Admit: 2012-12-07 | Discharge: 2012-12-10 | DRG: 766 | Disposition: A | Discharge status: Home / Self Care | Payer: Medicaid Other | Source: Ambulatory Visit | Attending: Family Medicine | Admitting: Family Medicine

## 2012-12-07 ENCOUNTER — Encounter: Payer: Self-pay | Admitting: Advanced Practice Midwife

## 2012-12-07 VITALS — BP 152/96 | Temp 97.1°F | Wt >= 6400 oz

## 2012-12-07 VITALS — BP 113/74 | HR 86 | Temp 98.2°F | Resp 18 | Ht 65.5 in | Wt >= 6400 oz

## 2012-12-07 DIAGNOSIS — O139 Gestational [pregnancy-induced] hypertension without significant proteinuria, unspecified trimester: Secondary | ICD-10-CM

## 2012-12-07 DIAGNOSIS — O99210 Obesity complicating pregnancy, unspecified trimester: Secondary | ICD-10-CM

## 2012-12-07 DIAGNOSIS — O99214 Obesity complicating childbirth: Secondary | ICD-10-CM

## 2012-12-07 DIAGNOSIS — E669 Obesity, unspecified: Secondary | ICD-10-CM

## 2012-12-07 DIAGNOSIS — O0933 Supervision of pregnancy with insufficient antenatal care, third trimester: Secondary | ICD-10-CM

## 2012-12-07 DIAGNOSIS — O133 Gestational [pregnancy-induced] hypertension without significant proteinuria, third trimester: Secondary | ICD-10-CM

## 2012-12-07 DIAGNOSIS — O0930 Supervision of pregnancy with insufficient antenatal care, unspecified trimester: Secondary | ICD-10-CM

## 2012-12-07 DIAGNOSIS — Z3009 Encounter for other general counseling and advice on contraception: Secondary | ICD-10-CM

## 2012-12-07 DIAGNOSIS — O99213 Obesity complicating pregnancy, third trimester: Secondary | ICD-10-CM

## 2012-12-07 HISTORY — DX: Essential (primary) hypertension: I10

## 2012-12-07 LAB — COMPREHENSIVE METABOLIC PANEL
AST: 11 U/L (ref 0–37)
Albumin: 2.3 g/dL — ABNORMAL LOW (ref 3.5–5.2)
Chloride: 100 mEq/L (ref 96–112)
Creatinine, Ser: 0.77 mg/dL (ref 0.50–1.10)
Total Bilirubin: 0.3 mg/dL (ref 0.3–1.2)
Total Protein: 6.8 g/dL (ref 6.0–8.3)

## 2012-12-07 LAB — CBC
HCT: 34.4 % — ABNORMAL LOW (ref 36.0–46.0)
MCH: 26.5 pg (ref 26.0–34.0)
MCHC: 33.7 g/dL (ref 30.0–36.0)
MCV: 78.7 fL (ref 78.0–100.0)
RDW: 13.9 % (ref 11.5–15.5)

## 2012-12-07 LAB — PROTEIN / CREATININE RATIO, URINE
Creatinine, Urine: 167.61 mg/dL
Protein Creatinine Ratio: 0.15 (ref 0.00–0.15)
Total Protein, Urine: 25 mg/dL

## 2012-12-07 LAB — POCT URINALYSIS DIP (DEVICE)
Bilirubin Urine: NEGATIVE
Glucose, UA: NEGATIVE mg/dL
Hgb urine dipstick: NEGATIVE
Ketones, ur: NEGATIVE mg/dL
pH: 6 (ref 5.0–8.0)

## 2012-12-07 SURGERY — Surgical Case
Anesthesia: Spinal | Site: Abdomen | Wound class: Clean Contaminated

## 2012-12-07 MED ORDER — ONDANSETRON HCL 4 MG PO TABS: 4.0000 mg | ORAL_TABLET | ORAL | Status: DC | PRN

## 2012-12-07 MED ORDER — ONDANSETRON HCL 4 MG/2ML IJ SOLN: 4.0000 mg | Freq: Three times a day (TID) | INTRAMUSCULAR | Status: DC | PRN

## 2012-12-07 MED ORDER — DEXTROSE 5 % IV SOLN
3.0000 g | INTRAVENOUS | Status: AC
  Administered 2012-12-07: 3 g via INTRAVENOUS
  Filled 2012-12-07: qty 3000

## 2012-12-07 MED ORDER — KETOROLAC TROMETHAMINE 60 MG/2ML IM SOLN
60.0000 mg | Freq: Once | INTRAMUSCULAR | Status: AC | PRN
  Administered 2012-12-07: 60 mg via INTRAMUSCULAR

## 2012-12-07 MED ORDER — METOCLOPRAMIDE HCL 5 MG/ML IJ SOLN
INTRAMUSCULAR | Status: AC
  Filled 2012-12-07: qty 2

## 2012-12-07 MED ORDER — EPHEDRINE SULFATE 50 MG/ML IJ SOLN
INTRAMUSCULAR | Status: DC | PRN
  Administered 2012-12-07: 10 mg via INTRAVENOUS
  Administered 2012-12-07: 5 mg via INTRAVENOUS

## 2012-12-07 MED ORDER — NALBUPHINE HCL 10 MG/ML IJ SOLN
5.0000 mg | INTRAMUSCULAR | Status: DC | PRN
  Filled 2012-12-07: qty 1

## 2012-12-07 MED ORDER — MENTHOL 3 MG MT LOZG: 1.0000 | LOZENGE | OROMUCOSAL | Status: DC | PRN

## 2012-12-07 MED ORDER — OXYTOCIN 10 UNIT/ML IJ SOLN
40.0000 [IU] | INTRAVENOUS | Status: DC | PRN
  Administered 2012-12-07: 40 [IU] via INTRAVENOUS

## 2012-12-07 MED ORDER — TERBUTALINE SULFATE 1 MG/ML IJ SOLN: 0.2500 mg | Freq: Once | INTRAMUSCULAR | Status: DC | PRN

## 2012-12-07 MED ORDER — SIMETHICONE 80 MG PO CHEW
80.0000 mg | CHEWABLE_TABLET | Freq: Three times a day (TID) | ORAL | Status: DC
  Administered 2012-12-08 – 2012-12-10 (×8): 80 mg via ORAL

## 2012-12-07 MED ORDER — FENTANYL CITRATE 0.05 MG/ML IJ SOLN: 25.0000 ug | INTRAMUSCULAR | Status: DC | PRN

## 2012-12-07 MED ORDER — LIDOCAINE HCL (PF) 1 % IJ SOLN: 30.0000 mL | INTRAMUSCULAR | Status: DC | PRN

## 2012-12-07 MED ORDER — KETOROLAC TROMETHAMINE 30 MG/ML IJ SOLN: 30.0000 mg | Freq: Four times a day (QID) | INTRAMUSCULAR | Status: AC | PRN

## 2012-12-07 MED ORDER — LACTATED RINGERS IV SOLN
INTRAVENOUS | Status: DC | PRN
  Administered 2012-12-07: 18:00:00 via INTRAVENOUS

## 2012-12-07 MED ORDER — METOCLOPRAMIDE HCL 5 MG/ML IJ SOLN
INTRAMUSCULAR | Status: DC | PRN
  Administered 2012-12-07: 10 mg via INTRAVENOUS

## 2012-12-07 MED ORDER — ACETAMINOPHEN 325 MG PO TABS: 650.0000 mg | ORAL_TABLET | ORAL | Status: DC | PRN

## 2012-12-07 MED ORDER — MORPHINE SULFATE 10 MG/ML IJ SOLN
INTRAMUSCULAR | Status: DC | PRN
  Administered 2012-12-07: 4 mg via INTRAVENOUS

## 2012-12-07 MED ORDER — IBUPROFEN 600 MG PO TABS
600.0000 mg | ORAL_TABLET | Freq: Four times a day (QID) | ORAL | Status: DC
  Administered 2012-12-08 – 2012-12-10 (×8): 600 mg via ORAL
  Filled 2012-12-07 (×8): qty 1

## 2012-12-07 MED ORDER — DIPHENHYDRAMINE HCL 25 MG PO CAPS: 25.0000 mg | ORAL_CAPSULE | Freq: Four times a day (QID) | ORAL | Status: DC | PRN

## 2012-12-07 MED ORDER — DIPHENHYDRAMINE HCL 50 MG/ML IJ SOLN: 25.0000 mg | INTRAMUSCULAR | Status: DC | PRN

## 2012-12-07 MED ORDER — SCOPOLAMINE 1 MG/3DAYS TD PT72
MEDICATED_PATCH | TRANSDERMAL | Status: AC
  Administered 2012-12-07: 1.5 mg
  Filled 2012-12-07: qty 1

## 2012-12-07 MED ORDER — OXYTOCIN BOLUS FROM INFUSION: 500.0000 mL | INTRAVENOUS | Status: DC

## 2012-12-07 MED ORDER — SCOPOLAMINE 1 MG/3DAYS TD PT72: 1.0000 | MEDICATED_PATCH | Freq: Once | TRANSDERMAL | Status: DC

## 2012-12-07 MED ORDER — ONDANSETRON HCL 4 MG/2ML IJ SOLN: 4.0000 mg | Freq: Four times a day (QID) | INTRAMUSCULAR | Status: DC | PRN

## 2012-12-07 MED ORDER — SODIUM BICARBONATE 8.4 % IV SOLN
INTRAVENOUS | Status: DC | PRN
  Administered 2012-12-07: 5 mL via EPIDURAL

## 2012-12-07 MED ORDER — DIPHENHYDRAMINE HCL 50 MG/ML IJ SOLN: 12.5000 mg | INTRAMUSCULAR | Status: DC | PRN

## 2012-12-07 MED ORDER — DIPHENHYDRAMINE HCL 25 MG PO CAPS: 25.0000 mg | ORAL_CAPSULE | ORAL | Status: DC | PRN

## 2012-12-07 MED ORDER — MORPHINE SULFATE (PF) 0.5 MG/ML IJ SOLN: INTRAMUSCULAR | Status: DC | PRN

## 2012-12-07 MED ORDER — SIMETHICONE 80 MG PO CHEW: 80.0000 mg | CHEWABLE_TABLET | ORAL | Status: DC | PRN

## 2012-12-07 MED ORDER — ONDANSETRON HCL 4 MG/2ML IJ SOLN
INTRAMUSCULAR | Status: DC | PRN
  Administered 2012-12-07: 4 mg via INTRAVENOUS

## 2012-12-07 MED ORDER — OXYCODONE-ACETAMINOPHEN 5-325 MG PO TABS: 1.0000 | ORAL_TABLET | ORAL | Status: DC | PRN

## 2012-12-07 MED ORDER — LANOLIN HYDROUS EX OINT: 1.0000 "application " | TOPICAL_OINTMENT | CUTANEOUS | Status: DC | PRN

## 2012-12-07 MED ORDER — PHENYLEPHRINE 40 MCG/ML (10ML) SYRINGE FOR IV PUSH (FOR BLOOD PRESSURE SUPPORT)
PREFILLED_SYRINGE | INTRAVENOUS | Status: AC
  Filled 2012-12-07: qty 5

## 2012-12-07 MED ORDER — KETOROLAC TROMETHAMINE 60 MG/2ML IM SOLN
INTRAMUSCULAR | Status: AC
  Filled 2012-12-07: qty 2

## 2012-12-07 MED ORDER — METOCLOPRAMIDE HCL 5 MG/ML IJ SOLN: 10.0000 mg | Freq: Three times a day (TID) | INTRAMUSCULAR | Status: DC | PRN

## 2012-12-07 MED ORDER — MEPERIDINE HCL 25 MG/ML IJ SOLN
INTRAMUSCULAR | Status: AC
  Filled 2012-12-07: qty 1

## 2012-12-07 MED ORDER — DIBUCAINE 1 % RE OINT: 1.0000 "application " | TOPICAL_OINTMENT | RECTAL | Status: DC | PRN

## 2012-12-07 MED ORDER — ACETAMINOPHEN 10 MG/ML IV SOLN
1000.0000 mg | Freq: Four times a day (QID) | INTRAVENOUS | Status: AC | PRN
  Filled 2012-12-07: qty 100

## 2012-12-07 MED ORDER — WITCH HAZEL-GLYCERIN EX PADS: 1.0000 "application " | MEDICATED_PAD | CUTANEOUS | Status: DC | PRN

## 2012-12-07 MED ORDER — NALOXONE HCL 0.4 MG/ML IJ SOLN: 0.4000 mg | INTRAMUSCULAR | Status: DC | PRN

## 2012-12-07 MED ORDER — ONDANSETRON HCL 4 MG/2ML IJ SOLN
4.0000 mg | INTRAMUSCULAR | Status: DC | PRN
  Administered 2012-12-08: 4 mg via INTRAVENOUS
  Filled 2012-12-07: qty 2

## 2012-12-07 MED ORDER — PRENATAL MULTIVITAMIN CH
1.0000 | ORAL_TABLET | Freq: Every day | ORAL | Status: DC
  Administered 2012-12-08 – 2012-12-09 (×2): 1 via ORAL
  Filled 2012-12-07 (×2): qty 1

## 2012-12-07 MED ORDER — SENNOSIDES-DOCUSATE SODIUM 8.6-50 MG PO TABS
2.0000 | ORAL_TABLET | Freq: Every day | ORAL | Status: DC
  Administered 2012-12-09 (×2): 2 via ORAL

## 2012-12-07 MED ORDER — PHENYLEPHRINE HCL 10 MG/ML IJ SOLN
INTRAMUSCULAR | Status: DC | PRN
  Administered 2012-12-07: 80 ug via INTRAVENOUS
  Administered 2012-12-07: 40 ug via INTRAVENOUS
  Administered 2012-12-07 (×2): 80 ug via INTRAVENOUS

## 2012-12-07 MED ORDER — IBUPROFEN 600 MG PO TABS: 600.0000 mg | ORAL_TABLET | Freq: Four times a day (QID) | ORAL | Status: DC | PRN

## 2012-12-07 MED ORDER — OXYTOCIN 40 UNITS IN LACTATED RINGERS INFUSION - SIMPLE MED
1.0000 m[IU]/min | INTRAVENOUS | Status: DC
  Administered 2012-12-07: 2 m[IU]/min via INTRAVENOUS
  Filled 2012-12-07: qty 1000

## 2012-12-07 MED ORDER — CITRIC ACID-SODIUM CITRATE 334-500 MG/5ML PO SOLN: 30.0000 mL | ORAL | Status: DC | PRN

## 2012-12-07 MED ORDER — LACTATED RINGERS IV SOLN
INTRAVENOUS | Status: DC
  Administered 2012-12-07 (×3): via INTRAVENOUS

## 2012-12-07 MED ORDER — CITRIC ACID-SODIUM CITRATE 334-500 MG/5ML PO SOLN
ORAL | Status: AC
  Administered 2012-12-07: 30 mL via ORAL
  Filled 2012-12-07: qty 15

## 2012-12-07 MED ORDER — LACTATED RINGERS IV SOLN
500.0000 mL | INTRAVENOUS | Status: DC | PRN
  Administered 2012-12-07: 800 mL via INTRAVENOUS

## 2012-12-07 MED ORDER — FLEET ENEMA 7-19 GM/118ML RE ENEM: 1.0000 | ENEMA | Freq: Every day | RECTAL | Status: DC | PRN

## 2012-12-07 MED ORDER — OXYTOCIN 40 UNITS IN LACTATED RINGERS INFUSION - SIMPLE MED: 62.5000 mL/h | INTRAVENOUS | Status: DC

## 2012-12-07 MED ORDER — TETANUS-DIPHTH-ACELL PERTUSSIS 5-2.5-18.5 LF-MCG/0.5 IM SUSP: 0.5000 mL | Freq: Once | INTRAMUSCULAR | Status: DC

## 2012-12-07 MED ORDER — MEPERIDINE HCL 25 MG/ML IJ SOLN
6.2500 mg | INTRAMUSCULAR | Status: DC | PRN
  Administered 2012-12-07: 6.25 mg via INTRAVENOUS

## 2012-12-07 MED ORDER — OXYTOCIN 40 UNITS IN LACTATED RINGERS INFUSION - SIMPLE MED: 62.5000 mL/h | INTRAVENOUS | Status: AC

## 2012-12-07 MED ORDER — NALOXONE HCL 1 MG/ML IJ SOLN
1.0000 ug/kg/h | INTRAVENOUS | Status: DC | PRN
  Filled 2012-12-07: qty 2

## 2012-12-07 MED ORDER — SODIUM CHLORIDE 0.9 % IJ SOLN: 3.0000 mL | INTRAMUSCULAR | Status: DC | PRN

## 2012-12-07 MED ORDER — LACTATED RINGERS IV SOLN
INTRAVENOUS | Status: DC
  Administered 2012-12-07 – 2012-12-08 (×2): via INTRAVENOUS

## 2012-12-07 MED ORDER — OXYCODONE-ACETAMINOPHEN 5-325 MG PO TABS
1.0000 | ORAL_TABLET | ORAL | Status: DC | PRN
  Administered 2012-12-10: 1 via ORAL
  Filled 2012-12-07: qty 1

## 2012-12-07 SURGICAL SUPPLY — 31 items
BRR ADH 6X5 SEPRAFILM 1 SHT (MISCELLANEOUS)
CONTAINER PREFILL 10% NBF 15ML (MISCELLANEOUS) IMPLANT
DRAPE LG THREE QUARTER DISP (DRAPES) ×2 IMPLANT
DRSG OPSITE 6X11 MED (GAUZE/BANDAGES/DRESSINGS) ×1 IMPLANT
DRSG OPSITE POSTOP 4X10 (GAUZE/BANDAGES/DRESSINGS) ×2 IMPLANT
DURAPREP 26ML APPLICATOR (WOUND CARE) ×2 IMPLANT
ELECT REM PT RETURN 9FT ADLT (ELECTROSURGICAL) ×2
ELECTRODE REM PT RTRN 9FT ADLT (ELECTROSURGICAL) ×1 IMPLANT
EXTRACTOR VACUUM M CUP 4 TUBE (SUCTIONS) IMPLANT
GLOVE BIOGEL PI IND STRL 6.5 (GLOVE) ×1 IMPLANT
GLOVE BIOGEL PI INDICATOR 6.5 (GLOVE) ×1
GLOVE SURG SS PI 6.0 STRL IVOR (GLOVE) ×2 IMPLANT
GOWN STRL REIN XL XLG (GOWN DISPOSABLE) ×4 IMPLANT
HOVERMATT SINGLE USE (MISCELLANEOUS) ×1 IMPLANT
KIT ABG SYR 3ML LUER SLIP (SYRINGE) IMPLANT
NDL HYPO 25X5/8 SAFETYGLIDE (NEEDLE) IMPLANT
NEEDLE HYPO 25X5/8 SAFETYGLIDE (NEEDLE) IMPLANT
NS IRRIG 1000ML POUR BTL (IV SOLUTION) ×2 IMPLANT
PACK C SECTION WH (CUSTOM PROCEDURE TRAY) ×2 IMPLANT
PAD OB MATERNITY 4.3X12.25 (PERSONAL CARE ITEMS) ×2 IMPLANT
RTRCTR C-SECT PINK 25CM LRG (MISCELLANEOUS) ×1 IMPLANT
SEPRAFILM MEMBRANE 5X6 (MISCELLANEOUS) IMPLANT
SLEEVE SCD COMPRESS KNEE MED (MISCELLANEOUS) IMPLANT
STAPLER VISISTAT 35W (STAPLE) ×2 IMPLANT
SUT PLAIN 0 NONE (SUTURE) IMPLANT
SUT PLAIN 2 0 XLH (SUTURE) ×1 IMPLANT
SUT VIC AB 0 CT1 36 (SUTURE) ×10 IMPLANT
SUT VIC AB 4-0 KS 27 (SUTURE) ×1 IMPLANT
TOWEL OR 17X24 6PK STRL BLUE (TOWEL DISPOSABLE) ×6 IMPLANT
TRAY FOLEY CATH 14FR (SET/KITS/TRAYS/PACK) ×2 IMPLANT
WATER STERILE IRR 1000ML POUR (IV SOLUTION) ×1 IMPLANT

## 2012-12-07 NOTE — Anesthesia Procedure Notes (Signed)
Epidural Patient location during procedure: OB  Preanesthetic Checklist Completed: patient identified, site marked, surgical consent, pre-op evaluation, timeout performed, IV checked, risks and benefits discussed and monitors and equipment checked  Epidural Patient position: sitting Prep: site prepped and draped and DuraPrep Patient monitoring: continuous pulse ox and blood pressure Approach: midline Injection technique: LOR air  Needle:  Needle type: Tuohy  Needle gauge: 17 G Needle length: 9 cm and 9 Needle insertion depth: 11 cm Catheter type: closed end flexible Catheter size: 19 Gauge Catheter at skin depth: 10 and 20 cm Test dose: negative  Assessment Events: blood not aspirated, injection not painful, no injection resistance, negative IV test and no paresthesia  Additional Notes Epidural hub tenting skin on insertion. LOR at 11+cm( difficult to tell)

## 2012-12-07 NOTE — Transfer of Care (Signed)
Immediate Anesthesia Transfer of Care Note  Patient: Samantha Wilkerson  Procedure(s) Performed: Procedure(s): CESAREAN SECTION (N/A)  Patient Location: PACU  Anesthesia Type:Epidural  Level of Consciousness: awake, alert  and oriented  Airway & Oxygen Therapy: Patient Spontanous Breathing  Post-op Assessment: Report given to PACU RN and Post -op Vital signs reviewed and stable  Post vital signs: Reviewed and stable  Complications: No apparent anesthesia complications

## 2012-12-07 NOTE — Anesthesia Postprocedure Evaluation (Signed)
Anesthesia Post Note  Patient: Samantha Wilkerson  Procedure(s) Performed: Procedure(s) (LRB): CESAREAN SECTION (N/A)  Anesthesia type: Epidural  Patient location: PACU  Post pain: Pain level controlled  Post assessment: Post-op Vital signs reviewed  Last Vitals:  Filed Vitals:   12/07/12 1930  BP: 85/71  Pulse: 75  Temp:   Resp: 29    Post vital signs: Reviewed  Level of consciousness: awake  Complications: No apparent anesthesia complications

## 2012-12-07 NOTE — H&P (Signed)
Attestation of Attending Supervision of Advanced Practitioner (CNM/NP): Evaluation and management procedures were performed by the Advanced Practitioner under my supervision and collaboration.  I have reviewed the Advanced Practitioner's note and chart, and I agree with the management and plan.  Leilanny Fluitt 12/07/2012 7:51 PM   

## 2012-12-07 NOTE — Progress Notes (Signed)
32 yo [id number] at [redacted]w[redacted]d being induced for gestational HTN. Patient with recurrent late deceleration while on pitocin and prolonged 4 minute deceleration without pitocin. Patient still in latent phase of labor. Discussed delivery via cesarean section in view of non reassuring fetal heart tracing. Risks, benefits and alternatives were explained including but not limited to risks of bleeding, infection and damage to adjacent organs. Patient verbalized understanding and all questions were answered.

## 2012-12-07 NOTE — H&P (Signed)
Samantha Wilkerson is a 32 y.o. female presenting for IOL for Gestational Hypertension. Maternal Medical History:  Reason for admission: Nausea. Pt is 32 yo G5P2002 @ [redacted]w[redacted]d presenting for IOL due to gestational hypertension.  Has been followed in High Risk clinic with normal PIH labs.  Denies other complications during this pregnancy.    Genetic Screen -  too late Anatomic [place]  - normal Glucose Screen - 149; normal 3hr GBS - Negative Feeding Preference - breast Contraception - considering BTL, no papers signed     OB History   Grav Para Term Preterm Abortions TAB SAB Ect Mult Living   5 2 2  2  0 0 0 0 2     Past Medical History  Diagnosis Date  . Abnormal Pap smear 2010    performed a colposcopy   Past Surgical History  Procedure Laterality Date  . Inner ear surgery     Family History: family history includes Cancer in her maternal aunt; Diabetes in her mother; and Hypertension in her mother. Social History:  reports that she quit smoking about 7 months ago. She has never used smokeless tobacco. She reports that she does not drink alcohol or use illicit drugs.   Prenatal Transfer Tool  Maternal Diabetes: No Genetic Screening: Normal Maternal Ultrasounds/Referrals: Normal Fetal Ultrasounds or other Referrals:  None Maternal Substance Abuse:  No Significant Maternal Medications:  None Significant Maternal Lab Results:  Lab values include: Group B Strep negative, Other:    11/30/2012 13:43  Sodium 134 (L)  Potassium 4.0  Chloride 105  CO2 19  BUN 8  Creatinine 0.61  Calcium 8.7  Glucose 88  Alkaline Phosphatase 100  Albumin 2.9 (L)  AST 10  ALT 10  Total Protein 6.5  Total Bilirubin 0.3  WBC 6.6  RBC 4.33  Hemoglobin 11.5 (L)  HCT 33.3 (L)  MCV 76.9 (L)  MCH 26.6  MCHC 34.5  RDW 14.3  Platelets 266  Total Protein, Urine 20  PROTEIN CREATININE RATIO 0.10  Creatinine, Urine 205.6    Other Comments:  None  Review of Systems  Constitutional: Negative.   Negative for fever and chills.  HENT: Negative.  Negative for congestion and neck pain.   Eyes: Negative.  Negative for blurred vision and double vision.  Respiratory: Negative.   Cardiovascular: Negative.  Negative for leg swelling.  Gastrointestinal: Negative.  Negative for heartburn, nausea, vomiting and abdominal pain.  Genitourinary: Negative.  Negative for dysuria, urgency and frequency.  Musculoskeletal: Negative.  Negative for myalgias and back pain.  Skin: Negative.   Neurological: Negative.  Negative for dizziness, sensory change, speech change and headaches.  Endo/Heme/Allergies: Negative.   Psychiatric/Behavioral: Negative.  Negative for depression.    Dilation: 2 Effacement (%): 50 Station: -2 Exam by:: Dr. Berline Chough Blood pressure 143/100, pulse 107, temperature 98.5 F (36.9 C), temperature source Oral, resp. rate 18, height 5' 5.5" (1.664 m), weight 189.513 kg (417 lb 12.8 oz), last menstrual period 03/08/2012. Maternal Exam:  Uterine Assessment: Contraction strength is mild.  Abdomen: obese   Introitus: Normal vulva. Normal vagina.  Ferning test: not done.  Nitrazine test: not done.  Pelvis: adequate for delivery.   Cervix: Cervix evaluated by digital exam.     Fetal Exam Fetal Monitor Review: Mode: ultrasound.   Baseline rate: 140.  Variability: moderate (6-25 bpm).   Pattern: no accelerations and accelerations present.    Fetal State Assessment: Category I - tracings are normal.     Physical Exam  Constitutional: She is oriented to person, place, and time. She appears well-developed and well-nourished. No distress.  HENT:  Head: Normocephalic and atraumatic.  Eyes: Right eye exhibits no discharge. Left eye exhibits no discharge.  Neck: No JVD present. No tracheal deviation present.  Cardiovascular: Normal rate.   Respiratory: Effort normal and breath sounds normal. No respiratory distress. She has no wheezes.  GI: Soft. Bowel sounds are normal. She  exhibits distension (gravid). There is no tenderness. There is no rebound and no guarding.  Genitourinary: Vagina normal.  Musculoskeletal: She exhibits no edema and no tenderness.  Neurological: She is alert and oriented to person, place, and time. She has normal reflexes. No cranial nerve deficit. She exhibits normal muscle tone. Coordination normal.  Skin: Skin is warm and dry. No rash noted. She is not diaphoretic. No erythema. No pallor.  Psychiatric: She has a normal mood and affect. Her behavior is normal. Judgment and thought content normal.    Prenatal labs: ABO, Rh: AB/POS/-- (03/03 1148) Antibody: NEG (03/03 1148) Rubella: 1.36 (03/03 1148) RPR: NON REAC (03/03 1148)  HBsAg: NEGATIVE (03/12 1249)  HIV: NON REACTIVE (03/03 1148)  GBS: Negative (04/02 0000)   Assessment/Plan: Admit to L&D for IOL due to Gestational HTN.  Continue to monitor for elevated BP.  Recheck CMET and Ur Pr:Cr ratio.  Labetalol prn. Continuous fetal monitoring.  Case discussed with Hilda Lias and Dr. Baron Sane, DO [facility] Resident - PGY-2 12/07/2012 2:24 PM

## 2012-12-07 NOTE — Anesthesia Preprocedure Evaluation (Signed)
Anesthesia Evaluation  Patient identified by MRN, date of birth, ID band Patient awake    Reviewed: Allergy & Precautions, H&P , Patient's Chart, lab work & pertinent test results  Airway Mallampati: II TM Distance: >3 FB Neck ROM: full    Dental no notable dental hx.    Pulmonary  breath sounds clear to auscultation  Pulmonary exam normal       Cardiovascular Exercise Tolerance: Good Rhythm:regular Rate:Normal     Neuro/Psych    GI/Hepatic   Endo/Other  Morbid obesity  Renal/GU      Musculoskeletal   Abdominal   Peds  Hematology   Anesthesia Other Findings   Reproductive/Obstetrics                           Anesthesia Physical Anesthesia Plan  ASA: II  Anesthesia Plan: Spinal and Combined Spinal and Epidural   Post-op Pain Management:    Induction:   Airway Management Planned:   Additional Equipment:   Intra-op Plan:   Post-operative Plan:   Informed Consent: I have reviewed the patients History and Physical, chart, labs and discussed the procedure including the risks, benefits and alternatives for the proposed anesthesia with the patient or authorized representative who has indicated his/her understanding and acceptance.   Dental Advisory Given  Plan Discussed with: CRNA  Anesthesia Plan Comments: (Lab work confirmed with CRNA in room. Platelets okay. Discussed spinal anesthetic, and patient consents to the procedure:  included risk of possible headache,backache, failed block, allergic reaction, and nerve injury. This patient was asked if she had any questions or concerns before the procedure started. )        Anesthesia Quick Evaluation

## 2012-12-07 NOTE — Patient Instructions (Signed)
Pregnancy - Third Trimester  The third trimester of pregnancy (the last 3 months) is a period of the most rapid growth for you and your baby. The baby approaches a length of 20 inches and a weight of 6 to 10 pounds. The baby is adding on fat and getting ready for life outside your body. While inside, babies have periods of sleeping and waking, suck their thumbs, and hiccups. You can often feel small contractions of the uterus. This is false labor. It is also called Braxton-Hicks contractions. This is like a practice for labor. The usual problems in this stage of pregnancy include more difficulty breathing, swelling of the hands and feet from water retention, and having to urinate more often because of the uterus and baby pressing on your bladder.   PRENATAL EXAMS  · Blood work may continue to be done during prenatal exams. These tests are done to check on your health and the probable health of your baby. Blood work is used to follow your blood levels (hemoglobin). Anemia (low hemoglobin) is common during pregnancy. Iron and vitamins are given to help prevent this. You may also continue to be checked for diabetes. Some of the past blood tests may be done again.  · The size of the uterus is measured during each visit. This makes sure your baby is growing properly according to your pregnancy dates.  · Your blood pressure is checked every prenatal visit. This is to make sure you are not getting toxemia.  · Your urine is checked every prenatal visit for infection, diabetes and protein.  · Your weight is checked at each visit. This is done to make sure gains are happening at the suggested rate and that you and your baby are growing normally.  · Sometimes, an ultrasound is performed to confirm the position and the proper growth and development of the baby. This is a test done that bounces harmless sound waves off the baby so your caregiver can more accurately determine due dates.  · Discuss the type of pain medication and  anesthesia you will have during your labor and delivery.  · Discuss the possibility and anesthesia if a Cesarean Section might be necessary.  · Inform your caregiver if there is any mental or physical violence at home.  Sometimes, a specialized non-stress test, contraction stress test and biophysical profile are done to make sure the baby is not having a problem. Checking the amniotic fluid surrounding the baby is called an amniocentesis. The amniotic fluid is removed by sticking a needle into the belly (abdomen). This is sometimes done near the end of pregnancy if an early delivery is required. In this case, it is done to help make sure the baby's lungs are mature enough for the baby to live outside of the womb. If the lungs are not mature and it is unsafe to deliver the baby, an injection of cortisone medication is given to the mother 1 to 2 days before the delivery. This helps the baby's lungs mature and makes it safer to deliver the baby.  CHANGES OCCURING IN THE THIRD TRIMESTER OF PREGNANCY  Your body goes through many changes during pregnancy. They vary from person to person. Talk to your caregiver about changes you notice and are concerned about.  · During the last trimester, you have probably had an increase in your appetite. It is normal to have cravings for certain foods. This varies from person to person and pregnancy to pregnancy.  · You may begin to   get stretch marks on your hips, abdomen, and breasts. These are normal changes in the body during pregnancy. There are no exercises or medications to take which prevent this change.  · Constipation may be treated with a stool softener or adding bulk to your diet. Drinking lots of fluids, fiber in vegetables, fruits, and whole grains are helpful.  · Exercising is also helpful. If you have been very active up until your pregnancy, most of these activities can be continued during your pregnancy. If you have been less active, it is helpful to start an exercise  program such as walking. Consult your caregiver before starting exercise programs.  · Avoid all smoking, alcohol, un-prescribed drugs, herbs and "street drugs" during your pregnancy. These chemicals affect the formation and growth of the baby. Avoid chemicals throughout the pregnancy to ensure the delivery of a healthy infant.  · Backache, varicose veins and hemorrhoids may develop or get worse.  · You will tire more easily in the third trimester, which is normal.  · The baby's movements may be stronger and more often.  · You may become short of breath easily.  · Your belly button may stick out.  · A yellow discharge may leak from your breasts called colostrum.  · You may have a bloody mucus discharge. This usually occurs a few days to a week before labor begins.  HOME CARE INSTRUCTIONS   · Keep your caregiver's appointments. Follow your caregiver's instructions regarding medication use, exercise, and diet.  · During pregnancy, you are providing food for you and your baby. Continue to eat regular, well-balanced meals. Choose foods such as meat, fish, milk and other low fat dairy products, vegetables, fruits, and whole-grain breads and cereals. Your caregiver will tell you of the ideal weight gain.  · A physical sexual relationship may be continued throughout pregnancy if there are no other problems such as early (premature) leaking of amniotic fluid from the membranes, vaginal bleeding, or belly (abdominal) pain.  · Exercise regularly if there are no restrictions. Check with your caregiver if you are unsure of the safety of your exercises. Greater weight gain will occur in the last 2 trimesters of pregnancy. Exercising helps:  · Control your weight.  · Get you in shape for labor and delivery.  · You lose weight after you deliver.  · Rest a lot with legs elevated, or as needed for leg cramps or low back pain.  · Wear a good support or jogging bra for breast tenderness during pregnancy. This may help if worn during  sleep. Pads or tissues may be used in the bra if you are leaking colostrum.  · Do not use hot tubs, steam rooms, or saunas.  · Wear your seat belt when driving. This protects you and your baby if you are in an accident.  · Avoid raw meat, cat litter boxes and soil used by cats. These carry germs that can cause birth defects in the baby.  · It is easier to loose urine during pregnancy. Tightening up and strengthening the pelvic muscles will help with this problem. You can practice stopping your urination while you are going to the bathroom. These are the same muscles you need to strengthen. It is also the muscles you would use if you were trying to stop from passing gas. You can practice tightening these muscles up 10 times a set and repeating this about 3 times per day. Once you know what muscles to tighten up, do not perform these   exercises during urination. It is more likely to cause an infection by backing up the urine.  · Ask for help if you have financial, counseling or nutritional needs during pregnancy. Your caregiver will be able to offer counseling for these needs as well as refer you for other special needs.  · Make a list of emergency phone numbers and have them available.  · Plan on getting help from family or friends when you go home from the hospital.  · Make a trial run to the hospital.  · Take prenatal classes with the father to understand, practice and ask questions about the labor and delivery.  · Prepare the baby's room/nursery.  · Do not travel out of the city unless it is absolutely necessary and with the advice of your caregiver.  · Wear only low or no heal shoes to have better balance and prevent falling.  MEDICATIONS AND DRUG USE IN PREGNANCY  · Take prenatal vitamins as directed. The vitamin should contain 1 milligram of folic acid. Keep all vitamins out of reach of children. Only a couple vitamins or tablets containing iron may be fatal to a baby or young child when ingested.  · Avoid use  of all medications, including herbs, over-the-counter medications, not prescribed or suggested by your caregiver. Only take over-the-counter or prescription medicines for pain, discomfort, or fever as directed by your caregiver. Do not use aspirin, ibuprofen (Motrin®, Advil®, Nuprin®) or naproxen (Aleve®) unless OK'd by your caregiver.  · Let your caregiver also know about herbs you may be using.  · Alcohol is related to a number of birth defects. This includes fetal alcohol syndrome. All alcohol, in any form, should be avoided completely. Smoking will cause low birth rate and premature babies.  · Street/illegal drugs are very harmful to the baby. They are absolutely forbidden. A baby born to an addicted mother will be addicted at birth. The baby will go through the same withdrawal an adult does.  SEEK MEDICAL CARE IF:  You have any concerns or worries during your pregnancy. It is better to call with your questions if you feel they cannot wait, rather than worry about them.  DECISIONS ABOUT CIRCUMCISION  You may or may not know the sex of your baby. If you know your baby is a boy, it may be time to think about circumcision. Circumcision is the removal of the foreskin of the penis. This is the skin that covers the sensitive end of the penis. There is no proven medical need for this. Often this decision is made on what is popular at the time or based upon religious beliefs and social issues. You can discuss these issues with your caregiver or pediatrician.  SEEK IMMEDIATE MEDICAL CARE IF:   · An unexplained oral temperature above 102° F (38.9° C) develops, or as your caregiver suggests.  · You have leaking of fluid from the vagina (birth canal). If leaking membranes are suspected, take your temperature and tell your caregiver of this when you call.  · There is vaginal spotting, bleeding or passing clots. Tell your caregiver of the amount and how many pads are used.  · You develop a bad smelling vaginal discharge with  a change in the color from clear to white.  · You develop vomiting that lasts more than 24 hours.  · You develop chills or fever.  · You develop shortness of breath.  · You develop burning on urination.  · You loose more than 2 pounds of weight   or gain more than 2 pounds of weight or as suggested by your caregiver.  · You notice sudden swelling of your face, hands, and feet or legs.  · You develop belly (abdominal) pain. Round ligament discomfort is a common non-cancerous (benign) cause of abdominal pain in pregnancy. Your caregiver still must evaluate you.  · You develop a severe headache that does not go away.  · You develop visual problems, blurred or double vision.  · If you have not felt your baby move for more than 1 hour. If you think the baby is not moving as much as usual, eat something with sugar in it and lie down on your left side for an hour. The baby should move at least 4 to 5 times per hour. Call right away if your baby moves less than that.  · You fall, are in a car accident or any kind of trauma.  · There is mental or physical violence at home.  Document Released: 07/30/2001 Document Revised: 10/28/2011 Document Reviewed: 02/01/2009  [employer]® Patient Information ©2013 [employer].

## 2012-12-07 NOTE — Progress Notes (Signed)
Samantha Wilkerson is a 32 y.o. [id number] at [redacted]w[redacted]d by ultrasound admitted for induction of labor due to Gestational Hypertension @ 39weeks.  Subjective: Doing well no complaints  Objective: BP 117/69  Pulse 96  Temp(Src) 98.5 F (36.9 C) (Oral)  Resp 18  Ht 5' 5.5" (1.664 m)  Wt 189.513 kg (417 lb 12.8 oz)  BMI 68.44 kg/m2  LMP 03/08/2012     FHT:  FHR: 155 bpm, variability: minimal ,  accelerations:  Present,  decelerations:  Present Pitocin stopped, repositioned.  IUPC and Fetal electrode placed UC:   Poor tracings, IUPC placed SVE:   Dilation: 2 Effacement (%): 50 Station: -2 Exam by:: Dr. Berline Chough  Labs: Lab Results  Component Value Date   WBC 8.2 12/07/2012   HGB 11.6* 12/07/2012   HCT 34.4* 12/07/2012   MCV 78.7 12/07/2012   PLT 253 12/07/2012   Assessment / Plan: Induction of labor due to gestational hypertension,  progressing well on pitocin  Labor: Difficulty with adequate monitoring.  Aminotomy performed, IUPC and scalp electrode placed.  Pt tolerated well. Preeclampsia:  no signs or symptoms of toxicity and labs pending Fetal Wellbeing:  Category II Pain Control:  Labor support without medications I/D:  n/a Anticipated MOD:  NSVD   Andrena Mews, DO [facility] Resident - PGY-2 12/07/2012 4:01 PM

## 2012-12-07 NOTE — Telephone Encounter (Signed)
Preadmission screen  

## 2012-12-07 NOTE — Op Note (Signed)
Samantha Wilkerson PROCEDURE DATE: 12/07/2012  PREOPERATIVE DIAGNOSIS: Intrauterine pregnancy at  [redacted]w[redacted]d weeks gestation; non-reassuring fetal status  POSTOPERATIVE DIAGNOSIS: The same  PROCEDURE:     Cesarean Section  SURGEON:  Dr. Catalina Wilkerson  ASSISTANT: none  INDICATIONS: Samantha Wilkerson is a 32 y.o. [id number] at [redacted]w[redacted]d scheduled for cesarean section secondary to non-reassuring fetal status.  Patient was being induced secondary to gestational hypertension. Fetal heart tracing demonstrating recurrent late deceleration in latent phase of labor. The risks of cesarean section discussed with the patient included but were not limited to: bleeding which may require transfusion or reoperation; infection which may require antibiotics; injury to bowel, bladder, ureters or other surrounding organs; injury to the fetus; need for additional procedures including hysterectomy in the event of a life-threatening hemorrhage; placental abnormalities wth subsequent pregnancies, incisional problems, thromboembolic phenomenon and other postoperative/anesthesia complications. The patient concurred with the proposed plan, giving informed written consent for the procedure.    FINDINGS:  Viable female infant in cephalic presentation.  Apgars 8 and 9.  Clear amniotic fluid.  Intact placenta, three vessel cord.  Normal uterus, fallopian tubes and ovaries bilaterally.  ANESTHESIA:    Spinal INTRAVENOUS FLUIDS:3000 ml ESTIMATED BLOOD LOSS: 400 ml URINE OUTPUT:  350 ml SPECIMENS: Placenta sent to L&D COMPLICATIONS: None immediate  PROCEDURE IN DETAIL:  The patient received intravenous antibiotics and had sequential compression devices applied to her lower extremities while in the preoperative area.  She was then taken to the operating room where anesthesia was induced and was found to be adequate. A foley catheter was placed into her bladder and attached to Samantha Wilkerson gravity. She was then placed in a dorsal supine position with  a leftward tilt, and prepped and draped in a sterile manner. After an adequate timeout was performed, a Pfannenstiel skin incision was made with scalpel and carried through to the underlying layer of fascia. The fascia was incised in the midline and this incision was extended bilaterally using the Mayo scissors. Kocher clamps were applied to the superior aspect of the fascial incision and the underlying rectus muscles were dissected off bluntly. A similar process was carried out on the inferior aspect of the facial incision. The rectus muscles were separated in the midline bluntly and the peritoneum was entered bluntly. The Alexis self-retaining retractor was introduced into the abdominal cavity. Attention was turned to the lower uterine segment where a bladder flap was created, and a transverse hysterotomy was made with a scalpel and extended bilaterally bluntly. The infant was successfully delivered, and cord was clamped and cut and infant was handed over to awaiting neonatology team. Uterine massage was then administered and the placenta delivered intact with three-vessel cord. The uterus was cleared of clot and debris.  The hysterotomy was closed with 0 Vicryl in a running locked fashion, and an imbricating layer was also placed with a 0 Vicryl. Overall, excellent hemostasis was noted. The pelvis copiously irrigated and cleared of all clot and debris. Hemostasis was confirmed on all surfaces.  The peritoneum and the muscles were reapproximated using 0 vicryl interrupted stitches. The fascia was then closed using 0 Vicryl in a running locked fashion.  The subcutaneous layer was reapproximated with plain gut and the skin was closed in a subcuticular fashion using 3.0 Vicryl.  The PICO wound vac was applied to the incision according to manufactures instructions. Device was turned on without any evidence of malfunction. The patient tolerated the procedure well. Sponge, lap, instrument and needle counts were  correct  x 2. She was taken to the recovery room in stable condition.    Samantha Wilkerson,PEGGYMD  12/07/2012 6:37 PM

## 2012-12-07 NOTE — Progress Notes (Signed)
Pulse- 98  Pain/pressure- lower back/pelvis BP recheck 138/91 For IOL tonight. Here for cervical evaluation: 2.5cm, soft, mid, thick, -3 vtx I think. Good FM. H/A mild yesterday, none today. PreE labs all nl 1 wk ago. No hx CHTN. 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  Has not had FATs> TC to BS and may come up for IOL now. Says she is reconsidering PPS.

## 2012-12-08 ENCOUNTER — Encounter (HOSPITAL_COMMUNITY): Payer: Self-pay | Admitting: Obstetrics and Gynecology

## 2012-12-08 LAB — CBC
Platelets: 216 10*3/uL (ref 150–400)
RBC: 3.88 MIL/uL (ref 3.87–5.11)
RDW: 14.1 % (ref 11.5–15.5)
WBC: 10.2 10*3/uL (ref 4.0–10.5)

## 2012-12-08 NOTE — Progress Notes (Signed)
Post Op day 1 Subjective: voiding Pt states she getting nauseous when she tries to get up, had 1 episode of vomiting this morning. Will try to get up and walk around 10am. Pt not tolerating PO well ice chips and saltines for now. Denies fevers, chills. Pain well controlled.   Pt denies head ache, vision changes, RUQ pain, swelling in legs.   Objective: Temp:  [97.1 F (36.2 C)-98.7 F (37.1 C)] 98.3 F (36.8 C) (04/22 0637) Pulse Rate:  [69-109] 85 (04/22 0637) Resp:  [18-29] 18 (04/22 0637) BP: (85-152)/(49-100) 130/76 mmHg (04/22 0637) SpO2:  [92 %-100 %] 96 % (04/22 0637) Weight:  [189.513 kg (417 lb 12.8 oz)] 189.513 kg (417 lb 12.8 oz) (04/21 1319)   Physical Exam:  General: alert, cooperative and appears stated age Lochia: appropriate Uterine Fundus: firm Incision: healing well DVT Evaluation: No evidence of DVT seen on physical exam. No cords or calf tenderness.   Recent Labs  12/07/12 1320 12/08/12 0630  HGB 11.6* 10.3*  HCT 34.4* 31.2*    Assessment/Plan: Breastfeeding and Contraception IUD   LOS: 1 day   Rosalee Kaufman 12/08/2012, 7:38 AM

## 2012-12-08 NOTE — Progress Notes (Signed)
I have seen the patient with the resident/student and agree with the above.  Tyller Bowlby Donovan  

## 2012-12-08 NOTE — Clinical Social Work Maternal (Signed)
    Clinical Social Work Department PSYCHOSOCIAL ASSESSMENT - MATERNAL/CHILD 12/08/2012  Patient:  Samantha Wilkerson, COULL  Account Number:  [id number]  Admit Date:  12/07/2012  Marjo Bicker Name:   BG [place]    Clinical Social Worker:  Nobie Putnam, LCSW   Date/Time:  12/08/2012 02:27 PM  Date Referred:  12/08/2012   Referral source  CN     Referred reason  [facility]   Other referral source:    I:  FAMILY / HOME ENVIRONMENT Child's legal guardian:  PARENT  Guardian - Name Guardian - Age Guardian - Address  Carmelite Grieder [address] [place], [place] [address]  Cordie Grice  (same as above)   Other household support members/support persons Name Relationship DOB   SON 2006   DAUGHTER 2008   Other support:    II  PSYCHOSOCIAL DATA Information Source:  Patient Interview  Event organiser Employment:   Financial resources:  Self Pay If [employer] - County:    School / Grade:   Maternity Care Coordinator / Child Services Coordination / Early Interventions:  Cultural issues impacting care:    III  STRENGTHS Strengths  Adequate Resources  Home prepared for Child (including basic supplies)  Supportive family/friends   Strength comment:    IV  RISK FACTORS AND CURRENT PROBLEMS Current Problem:  YES   Risk Factor & Current Problem Patient Issue Family Issue Risk Factor / Current Problem Comment  Other - See comment [employer]    V  SOCIAL WORK ASSESSMENT CSW referral received to assess reason for [facility] @ 32 weeks. Pt told CSW that she didn't learn about the pregnancy until "late" & was dealing with her father passing away.  She denies any illegal substance use & verbalized understanding of hospital drug testing policy.  UDS is negative, meconium results are pending.  FOB at the bedside, attentive to infant & supportive.  She has all the necessary supplies for the infant & good family support.  Pt appears to be appropriate at this time & bonding well.  CSW will  continue to monitor drug screen results & make a referral if needed.      VI SOCIAL WORK PLAN Social Work Plan  No Further Intervention Required / No Barriers to Discharge   Type of pt/family education:   If child protective services report - county:   If child protective services report - date:   Information/referral to community resources comment:   Other social work plan:

## 2012-12-08 NOTE — Progress Notes (Signed)
UR completed 

## 2012-12-08 NOTE — Anesthesia Postprocedure Evaluation (Signed)
  Anesthesia Post-op Note  Patient: Samantha Wilkerson  Procedure(s) Performed: Procedure(s): CESAREAN SECTION (N/A)  Patient Location: PACU and Mother/Baby  Anesthesia Type:Spinal  Level of Consciousness: awake, alert , oriented and patient cooperative  Airway and Oxygen Therapy: Patient Spontanous Breathing  Post-op Pain: none  Post-op Assessment: Post-op Vital signs reviewed, Patient's Cardiovascular Status Stable and Respiratory Function Stable  Post-op Vital Signs: Reviewed and stable  Complications: No apparent anesthesia complications

## 2012-12-09 LAB — BIRTH TISSUE RECOVERY COLLECTION (PLACENTA DONATION)

## 2012-12-09 NOTE — Lactation Note (Addendum)
This note was copied from the chart of Samantha Wilkerson. Lactation Consultation Note Mom states she is going to be using formula and pumping to express br milk. Reviewed pumping and hand expression with mom.  Enc mom to call for help if she has any concerns, otherwise, mom needs follow up on a PRN basis only.   Patient Name: Samantha Wilkerson [id number] Date: 12/09/2012 Reason for consult: Follow-up assessment   Maternal Data    Feeding Feeding Type: Formula Feeding method: Bottle  LATCH Score/Interventions                      Lactation Tools Discussed/Used     Consult Status Consult Status: PRN    Lenard Forth 12/09/2012, 10:06 AM

## 2012-12-09 NOTE — Progress Notes (Signed)
Subjective: Postpartum Day #2: Cesarean Delivery Patient reports tolerating PO, + flatus and no problems voiding.    Objective: Vital signs in last 24 hours: Temp:  [98.1 F (36.7 C)-98.3 F (36.8 C)] 98.1 F (36.7 C) (04/23 8119) Pulse Rate:  [73-103] 88 (04/23 0637) Resp:  [18-20] 18 (04/23 0637) BP: (108-128)/(65-86) 117/77 mmHg (04/23 0637) SpO2:  [97 %-98 %] 97 % (04/22 1729)  Physical Exam:  General: alert, cooperative, appears stated age and no distress Lochia: appropriate Uterine Fundus: firm Incision: wound vac in place minimal saturation of dressing DVT Evaluation: No evidence of DVT seen on physical exam. Negative Homan's sign. No cords or calf tenderness. Calf/Ankle edema is present. 1+/4   Recent Labs  12/07/12 1320 12/08/12 0630  HGB 11.6* 10.3*  HCT 34.4* 31.2*    Assessment/Plan: Status post Cesarean section. Doing well postoperatively.  Continue current care Consider D/c at 48 hours tonight. Pain well controlled; will provide rx for Ibuprofen and Oxycodone Will have f/u on Monday 4/28 for removal of VAC.  No showering until morning of appointment as to ensure VAC continues to operate. Interested in Mirena @ 6 week   Andrena Mews, DO [facility] Resident - PGY-2 12/09/2012 9:57 AM

## 2012-12-10 MED ORDER — IBUPROFEN 600 MG PO TABS: 600.0000 mg | ORAL_TABLET | Freq: Four times a day (QID) | ORAL | Status: DC

## 2012-12-10 MED ORDER — DOCUSATE SODIUM 100 MG PO CAPS: 100.0000 mg | ORAL_CAPSULE | Freq: Two times a day (BID) | ORAL | Status: DC | PRN

## 2012-12-10 MED ORDER — OXYCODONE-ACETAMINOPHEN 5-325 MG PO TABS: 1.0000 | ORAL_TABLET | ORAL | Status: DC | PRN

## 2012-12-10 NOTE — Discharge Summary (Signed)
Obstetric Discharge Summary Reason for Admission: induction of labor for Gestational HTN Prenatal Procedures: [place], PIH labs, NST, all reassuring Intrapartum Procedures: cesarean: low cervical, transverse and secondary to non-reassuring fetal tracing Postpartum Procedures: none Complications-Operative and Postpartum: none Hemoglobin  Date Value Range Status  12/08/2012 10.3* 12.0 - 15.0 g/dL Final     HCT  Date Value Range Status  12/08/2012 31.2* 36.0 - 46.0 % Final    Physical Exam:  General: alert, cooperative, no distress and morbidly obese Lochia: appropriate Uterine Fundus: firm Incision: healing well, wound vac in place DVT Evaluation: No evidence of DVT seen on physical exam. Negative Homan's sign. No cords or calf tenderness. No significant calf/ankle edema.  Discharge Diagnoses: Term Pregnancy-delivered  Brief Hospital Course:  Pt was scheduled for IOL due to gestational HTN.  Pt underwent induction however developed non-reassuring fetal heart tones with minimal uterine contractions.  Responded well to Oxygen and repositioning however recurrent with each attempt at induction.  Pt taken to C-Section and had uneventful operation.  PICO Wound vac placed due to super-morbid obesity.  Wound vac to stay in place for 1 week.  F/u scheduled   Discharge Information: Date: 12/10/2012 Activity: pelvic rest and lifting restrition, avoid getting PICO  VAC wet Diet: routine Medications: Ibuprofen, Colace and Percocet Condition: stable Instructions: refer to practice specific booklet Discharge to: home Follow-up Information   Follow up with [facility] On 12/14/2012. (as scheduled)    Contact information:   [address] [place] [address] [phone]      Newborn Data: Live born female  Birth Weight: 6 lb 12.3 oz (3070 g) APGAR: 8, 9  Home with mother.  Andrena Mews, DO [facility] Resident - PGY-2 12/10/2012 9:21 AM I examined pt  and agree with documentation above and resident plan of care. [facility]

## 2012-12-10 NOTE — Plan of Care (Signed)
Problem: Phase II Progression Outcomes Goal: Incision intact & without signs/symptoms of infection Outcome: Progressing Patient has a pressure dressing on unable to visualize incision

## 2012-12-11 ENCOUNTER — Encounter: Payer: Self-pay | Admitting: *Deleted

## 2012-12-12 NOTE — Progress Notes (Signed)
I have seen and examined this patient and I agree with the above. Cam Hai 8:37 AM 12/12/2012

## 2012-12-14 ENCOUNTER — Encounter: Payer: Self-pay | Admitting: Obstetrics & Gynecology

## 2012-12-14 ENCOUNTER — Ambulatory Visit (INDEPENDENT_AMBULATORY_CARE_PROVIDER_SITE_OTHER): Payer: Self-pay | Admitting: Obstetrics & Gynecology

## 2012-12-14 VITALS — BP 160/93 | HR 61 | Temp 96.9°F | Ht 65.0 in | Wt >= 6400 oz

## 2012-12-14 DIAGNOSIS — Z09 Encounter for follow-up examination after completed treatment for conditions other than malignant neoplasm: Secondary | ICD-10-CM

## 2012-12-14 MED ORDER — OXYCODONE-ACETAMINOPHEN 5-325 MG PO TABS: 1.0000 | ORAL_TABLET | ORAL | Status: DC | PRN

## 2012-12-14 NOTE — Progress Notes (Signed)
  Subjective:    Patient ID: Samantha Wilkerson, female    DOB: [birth year], 32 y.o.   MRN: [id number]  HPI  32 yo lady with morbid obesity who is here 1 week post op s/p PLTCS for non-reassuring FHR for wound vac removal [place]). She has no complaints today except that she would like a refill of percocet (She was given #20 at discharge).   Review of Systems     Objective:   Physical Exam  Her wound vac was removed and the incision is healing well. Her abdomen is benign.      Assessment & Plan:  Post op for wound vac removal.  Refill #30 percocet She plans for a Mirena  RTC 5 weeks for pp visit.

## 2012-12-14 NOTE — Progress Notes (Signed)
Had c/s 12/07/12. Here for wound check and wound vac removal

## 2013-01-08 ENCOUNTER — Encounter: Payer: Self-pay | Admitting: Obstetrics & Gynecology

## 2013-01-08 ENCOUNTER — Ambulatory Visit (INDEPENDENT_AMBULATORY_CARE_PROVIDER_SITE_OTHER): Payer: Self-pay | Admitting: Obstetrics & Gynecology

## 2013-01-08 DIAGNOSIS — M549 Dorsalgia, unspecified: Secondary | ICD-10-CM

## 2013-01-08 MED ORDER — IBUPROFEN 600 MG PO TABS: 600.0000 mg | ORAL_TABLET | Freq: Four times a day (QID) | ORAL | Status: DC

## 2013-01-08 NOTE — Patient Instructions (Signed)
Levonorgestrel intrauterine device (IUD) What is this medicine? LEVONORGESTREL IUD (LEE voe nor jes trel) is a contraceptive (birth control) device. The device is placed inside the uterus by a healthcare professional. It is used to prevent pregnancy and can also be used to treat heavy bleeding that occurs during your period. Depending on the device, it can be used for 3 to 5 years. This medicine may be used for other purposes; ask your health care provider or pharmacist if you have questions. What should I tell my health care provider before I take this medicine? They need to know if you have any of these conditions: -abnormal Pap smear -cancer of the breast, uterus, or cervix -diabetes -endometritis -genital or pelvic infection now or in the past -have more than one sexual partner or your partner has more than one partner -heart disease -history of an ectopic or tubal pregnancy -immune system problems -IUD in place -liver disease or tumor -problems with blood clots or take blood-thinners -use intravenous drugs -uterus of unusual shape -vaginal bleeding that has not been explained -an unusual or allergic reaction to levonorgestrel, other hormones, silicone, or polyethylene, medicines, foods, dyes, or preservatives -pregnant or trying to get pregnant -breast-feeding How should I use this medicine? This device is placed inside the uterus by a health care professional. Talk to your pediatrician regarding the use of this medicine in children. Special care may be needed. Overdosage: If you think you have taken too much of this medicine contact a poison control center or emergency room at once. NOTE: This medicine is only for you. Do not share this medicine with others. What if I miss a dose? This does not apply. What may interact with this medicine? Do not take this medicine with any of the following medications: -amprenavir -bosentan -fosamprenavir This medicine may also interact with  the following medications: -aprepitant -barbiturate medicines for inducing sleep or treating seizures -bexarotene -griseofulvin -medicines to treat seizures like carbamazepine, ethotoin, felbamate, oxcarbazepine, phenytoin, topiramate -modafinil -pioglitazone -rifabutin -rifampin -rifapentine -some medicines to treat HIV infection like atazanavir, indinavir, lopinavir, nelfinavir, tipranavir, ritonavir -St. John's wort -warfarin This list may not describe all possible interactions. Give your health care provider a list of all the medicines, herbs, non-prescription drugs, or dietary supplements you use. Also tell them if you smoke, drink alcohol, or use illegal drugs. Some items may interact with your medicine. What should I watch for while using this medicine? Visit your doctor or health care professional for regular check ups. See your doctor if you or your partner has sexual contact with others, becomes HIV positive, or gets a sexual transmitted disease. This product does not protect you against HIV infection (AIDS) or other sexually transmitted diseases. You can check the placement of the IUD yourself by reaching up to the top of your vagina with clean fingers to feel the threads. Do not pull on the threads. It is a good habit to check placement after each menstrual period. Call your doctor right away if you feel more of the IUD than just the threads or if you cannot feel the threads at all. The IUD may come out by itself. You may become pregnant if the device comes out. If you notice that the IUD has come out use a backup birth control method like condoms and call your health care provider. Using tampons will not change the position of the IUD and are okay to use during your period. What side effects may I notice from receiving this medicine?   Side effects that you should report to your doctor or health care professional as soon as possible: -allergic reactions like skin rash, itching or  hives, swelling of the face, lips, or tongue -fever, flu-like symptoms -genital sores -high blood pressure -no menstrual period for 6 weeks during use -pain, swelling, warmth in the leg -pelvic pain or tenderness -severe or sudden headache -signs of pregnancy -stomach cramping -sudden shortness of breath -trouble with balance, talking, or walking -unusual vaginal bleeding, discharge -yellowing of the eyes or skin Side effects that usually do not require medical attention (report to your doctor or health care professional if they continue or are bothersome): -acne -breast pain -change in sex drive or performance -changes in weight -cramping, dizziness, or faintness while the device is being inserted -headache -irregular menstrual bleeding within first 3 to 6 months of use -nausea This list may not describe all possible side effects. Call your doctor for medical advice about side effects. You may report side effects to FDA at 1-800-FDA-1088. Where should I keep my medicine? This does not apply. NOTE: This sheet is a summary. It may not cover all possible information. If you have questions about this medicine, talk to your doctor, pharmacist, or health care provider.  2013, Elsevier/Gold Standard. (09/05/2011 1:54:04 PM)  

## 2013-01-08 NOTE — Progress Notes (Signed)
Patient ID: Samantha Wilkerson, female   DOB: 12/09/1980, 32 y.o.   MRN: [id number] Subjective: back pain     Samantha Wilkerson is a 32 y.o. female who presents for a postpartum visit. She is 5 weeks postpartum following a low cervical transverse Cesarean section. I have fully reviewed the prenatal and intrapartum course. The delivery was at 39 gestational weeks. Outcome: primary cesarean section, low transverse incision. Anesthesia: spinal. Postpartum course has been complicated by back pain that radiates and is. Baby's course has been good. Baby is feeding by bottle -  . Bleeding no bleeding. Bowel function is normal. Bladder function is normal. Patient is not sexually active. Contraception method is planned Mirena. Postpartum depression screening: negative.  The following portions of the patient's history were reviewed and updated as appropriate: allergies, current medications, past family history, past medical history, past social history, past surgical history and problem list.  Review of Systems Musculoskeletal:positive for back pain   Objective:    BP 132/91  Pulse 96  Temp(Src) 97.3 F (36.3 C) (Oral)  Ht 5\' 5"  (1.651 m)  Wt 406 lb 4.8 oz (184.296 kg)  BMI 67.61 kg/m2  Breastfeeding? No  General:  no distress   Breasts:     Lungs:    Heart:     Abdomen: soft, non-tender; bowel sounds normal; no masses,  no organomegaly and incision healing well   Vulva:  not evaluated  Vagina: not evaluated  Cervix:     Corpus: not examined  Adnexa:  not evaluated  Rectal Exam: Not performed.        Assessment:     5 week postpartum exam. Pap smear not done at today's visit.   Plan:    1. Contraception: IUD 2. Waiting for Medicaid or scholarship for IUD 3. Follow up in: a few weeks or as needed.  4. Consider chiropractic for back pain. Reorder Motrin 600 mg  Adam Phenix, MD 01/08/2013

## 2013-02-15 ENCOUNTER — Ambulatory Visit: Payer: Self-pay | Admitting: Obstetrics & Gynecology

## 2013-04-05 ENCOUNTER — Ambulatory Visit: Payer: Self-pay | Admitting: Obstetrics & Gynecology

## 2014-06-20 ENCOUNTER — Encounter: Payer: Self-pay | Admitting: Obstetrics & Gynecology

## 2017-07-02 NOTE — Anesthesia Postprocedure Evaluation (Signed)
 Anesthesia Post Evaluation  Patient: LYRIQUE HAKIM Procedures performed: Procedure(s) (LRB): EGD (N/A)  Last vitals:  Vitals:   07/02/17 0755 07/02/17 0842  BP: 148/94 164/95  Pulse: 96 99  Temp: 98.2 F (36.8 C)   Resp: 20 19  SpO2: 98% 100%  PainSc: 0-Zero     Final Anesthetic Type: MAC  Patient Evaluation Location: GI  Patient Participation: Patient participated  Level of Consciousness: awake and alert and oriented   Pain Management: adequately controlled during entire PACU stay  Nausea/Vomiting: None  Post-op vitals signs: Stable  Post op temperature: Normothermic   Cardiovascular Status: hemodynamically stable  Respiratory Status: Stable, room air, spontaneous  Postoperative Hydration: adequately hydrated   Post op disposition: [facility]   Anesthetic Complications: no anesthesia complications noted     Electronically signed by: Gordy Morene Sharper, MD 07/02/17 [phone]

## 2017-07-02 NOTE — Unmapped External Note (Signed)
 ENDOSCOPY NOTE  MRN # [id number]   Date of Surgery: 07/02/2017  Pre-op Diagnosis: Morbid obesity (HCC) [E66.01]  Key Findings: Small sliding hiatal hernia.  Benign-appearing fundic gland gastric body polyps.  Otherwise normal upper endoscopy  Procedure: EGD and biopsy  Surgeon:  Vanderbilt Pan, MD  Anesthesia: Monitored Anesthesia Care (MAC)  Estimated Blood Loss: 0  Specimens:  Specimens    Start     Ordered   07/02/17 0900  Surgical Pathology Tissue Exam    Start Status  07/02/17 0900 Collected (07/02/17 0859)     07/02/17 0900       Complications: No [1]   Indication for procedure:    Patient referred for bariatric surgery pre-operative evaluation. See details in history and physical. Options risks and benefits of upper endoscopy were discussed with the patient who agreed to undergo the procedure.    Procedure Note:    The patient was brought back to the endoscopy suite and was properly monitored and positioned. IV sedation was administered. The Olympus video upper endoscope was then inserted and passed under direct vision into the second portion of the duodenum. The scope was then withdrawn carefully inspecting all areas of the upper GI tract. Endoscopic findings included see above.  Multiple 2-3 mm gastric body benign, fundic gland type polyps.  Diagnostic procedures included cold biopsy of 1 of the polyps.  Therapeutic procedures included none.  The scope was then withdrawn and the patient tolerated the procedure well.   Recommendations:  Continue Nexium.  Elevate head of bed at nighttime.  Follow-up bariatric surgery.   Electronically signed by: Vanderbilt Krystal Pan, MD 07/02/17 [phone]

## 2017-10-29 NOTE — Progress Notes (Signed)
 Bariatric Preop/final nutritional counseling    Assessment:    ZORIA RAWLINSON presents with a documented history of obesity with associated co-morbidities and, based on these criteria, qualifies as a candidate for bariatric surgery. She is interested in laparoscopic sleeve gastrectomy and laparoscopic roux-en-y bypass.    Plan:   Today, we reviewed history and completed a physical exam, re-counseled the patient about indications, risks and benefits of bariatric surgery, and discussed surgical and nonsurgical alternative treatment modalities.  The patient has tried numerous methods for weight loss without success over the last 12 months continuously.  We enrolled them in our comprehensive bariatric surgery program, and they were seen, and cleared by exercise, nutrition and psychiatric teams.  They have support from their primary care physician, who has written a support letter, noting there struggle with weight, despite dietary counseling, nutritional guidance, and exercise plans.  Additionally, they have had additional clearances by cardiac, pulmonary, GI and/or any other related specialties, if needed.  All laboratory data, EKG, nicotine test and H. Pylori are included in the patients workup, and have checked out.  They have shown compliance, following the below noted exercise and nutrition guidelines.  They have lost pounds, and dropped BMI points.  We feel they will be excellent candidates for proceeding with bariatric surgery.  At this point we have submitted them for insurance approval and once we have approval we will proceed with laparoscopic sleeve gastrectomy.  All risks and benefits were discussed with the patient and they understand and agree and wish to proceed with laparoscopic sleeve gastrectomy, possible open.  We will schedule pending bariatric surgery approval from insurance.  The patient has attended our information seminar and this was reviewed as well as operative details as  well as surgical risks, including bleeding, infection, scar tissue, injury to normal surrounding structures, conversion to open technique, heart and lung complications, DVT and PE.  We also discussed postoperative risks of gastric leak, stomal stenosis, marginal ulcers, cholelithiasis, incisional and internal hernias, short bowel syndrome, nutritional derangements, hypoglycemia, change in bowel habits, and inadequate weight loss.  Avoidance of NSAIDs, alcohol and nicotine was encouraged long term to reduce the risk of ulcers.  We discussed the risk of postoperative transference of addictions and skin laxity, as well as the psychological effects these may have on body image and behaviors.    ---------------------------------------------------------------------------------------------------------------------  Exercise plan: We recommended that the patient continue walking program to 6-7 days a week for 30-45 minutes, maintaining an increased pace, advance to a higher intensity cardio program, 3 days per week for 45 minutes with a target heart rate of 120 , increase cardio work out intensity, 60 minutes 3-4 days a week and add core strength training, 2 days a week for 30-45 minutes.  We anticipate modifying the exercise plan as their conditioning improves and working to establish a regular exercise habit.   Diet plan:  For dietary changes, we recommended that the patient continue adopting healthy eating habits, shown that they are eliminating or reducing sugar, refined carbohydrates, fat, and caffeine from the diet.  We also want them to continue to focus on more lean protein sources, raw fruits and vegetables, and whole wheat products.  Our registered dietician will continue to work with the patient on advising appropriate caloric requirements, continue to teach nutritional basics and calorie counting, and advise regarding vitamin supplementation as well.    Behavioral plan:  We discussed the importance of  continuing with the current regiment of regular small meals throughout  the day and trying not to skip meals, avoidance of over-eating, reading labels and making healthy food choices, thorough chewing, eating meals over a period of 20-30 minutes to allow the brain to sense satiety, and eliminating easy sources of nutritionless calories from the pantry and refrigerator.  For surgery, the following VT E prophylaxis calculator reveals the following risk factor profile for this patient:  VTE Risk Calculator: 10  Risk Factor         Points                    LAGB                      0   Age <30               1   BMI <40           1     Female Sex               2    LRNYGB                 3   Age 48-39            2   BMI 40-49        2   Any Smoking Hx     2 Sleeve                     4   Age 62-49            3   BMI 50-59        3   OR Time >3 Hrs*     2 ORNYGB                6   Age 17-59            4   BMI 60+           4    Prior Hx of VTE       5 BPD/DS                  8   Age 74+               5  Total score 10 .  0-14 points- low risk- -DC home without anticoagulation 15-19 points- Medium risk -DC home with anticoagulation prophylaxis  20-28 points- High risk -DC home with anticoagulation therapeutic dosing  ===========================================================  History of Present Illness:  Primary Physician: Slater Darice Sole, MD  Reason for Consult:  Hypertension and GERD  REIGNA RUPERTO is a 37 y.o. female referred for evaluation and treatment of morbid obesity.  She describes a history of obesity lasting years and reports multiple failed attempts at structured weight loss.  These include multiple weight loss strategies working out, diet plans, Fen/Phen, she actually lost 80 pounds and was on the Dr. Wendolyn show with her twin sister.  Unfortunately gained all the way back.  She has not tried medication.  States she has been thinking about surgery for about 10 years.  Insurance has  been a barrier in the past.  She states she is always had issues with obesity since childhood.  She states she noticed significant weight gain after her first child, she gained 100 pounds and has gained weight with children since then.     The patient suffers from the following obesity-related  co-mor bidities:  Hypertension and GERD.  She also describes these other OSA-related symptoms: daytime fatigue.  She describes a weight loss goal of over 100 lbs and increasing her quality of life goals.  Body mass index is 69.89 kg/m.   Review of Systems Review of Systems  Constitutional: Negative for chills and fever.  HENT: Negative for ear pain, hearing loss and tinnitus.   Eyes: Negative for blurred vision and double vision.  Respiratory: Negative for cough.   Cardiovascular: Negative for chest pain, palpitations and orthopnea.  Gastrointestinal: Negative for heartburn and nausea.  Genitourinary: Negative for dysuria and urgency.  Musculoskeletal: Negative for myalgias.  Skin: Negative for rash.  Neurological: Negative for dizziness and headaches.    Past Medical History Past Medical History:  Diagnosis Date  . Acid reflux   . Hearing loss    bilateral  . Hypertension     Past Surgical History Past Surgical History:  Procedure Laterality Date  . CESAREAN SECTION    . ESOPHAGOSCOPY / EGD N/A 07/02/2017   Procedure: EGD;  Surgeon: Vanderbilt Krystal Pan, MD;  Location: [facility] OR;  Service: General;  Laterality: N/A;  . TYMPANOSTOMY TUBE PLACEMENT      Allergies Patient has no known allergies.  Medications Current Outpatient Prescriptions  Medication Sig Dispense Refill  . amLODIPine (NORVASC) 10 MG tablet Take 1 tablet (10 mg total) by mouth daily. 90 tablet 1  . esomeprazole (NEXIUM) 40 MG capsule Take 40 mg by mouth daily.    SABRA HYDROcodone-acetaminophen  7.5-325 mg/15 mL solution Take 10 mls by mouth every 4 (four) hours as needed for pain. (For Post Op use) 420 mL 0  .  metoclopramide  HCl (REGLAN ) 5 MG tablet Take 1 tablet (5 mg total) by mouth 4 times daily for 14 days. Use for Nausea and Muscle Contractions  Post Bariatric Surgery 56 tablet 1  . ondansetron  (ZOFRAN -ODT) 4 MG disintegrating tablet Take 1 tablet by mouth every 8 (eight) hours as needed for Nausea. For post- op use 20 tablet 2  . phentermine 37.5 MG capsule Take 1 capsule (37.5 mg total) by mouth every morning. 30 capsule 2  . scopolamine  (TRANSDERM-SCOP) 1 mg over 3 days patch Place 1 patch onto the skin every third day. Note: 1.5 mg patch delivers 1 mg over 3 days For Post Op Use 3 patch 0  . ursodiol (ACTIGALL) 300 mg capsule Take 1 capsule (300 mg total) by mouth 2 times daily for 30 days. 60 capsule 0   No current facility-administered medications for this visit.     Family History Family History  Problem Relation Age of Onset  . Hypertension Mother   . COPD Father   . Cancer Maternal Aunt   . Glaucoma Neg Hx   . Macular degeneration Neg Hx     Social History: Social History   Social History  . Marital status: Single    Spouse name: N/A  . Number of children: 3  . Years of education: 14   Occupational History  . asst director    Social History Main Topics  . Smoking status: Former Games developer  . Smokeless tobacco: Never Used  . Alcohol use 15.0 oz/week    3 Glasses of wine (5 oz./glass) per week  . Drug use: No  . Sexual activity: Not Asked   Other Topics Concern  . None   Social History Narrative  . None    Physical Examination: General appearance - alert, well appearing, and in no distress  Mental status - alert, oriented to person, place, and time Neck - supple, no significant adenopathy Chest - clear to auscultation, no wheezes, rales or rhonchi, symmetric air entry Heart - normal rate, regular rhythm, normal S1, S2, no murmurs, rubs, clicks or gallops Abdomen - soft, nontender, nondistended, no masses or organomegaly Musculoskeletal - no joint tenderness,  deformity or swelling Extremities - peripheral pulses normal, no pedal edema, no clubbing or cyanosis Skin - normal coloration and turgor, no rashes, no suspicious skin lesions noted      Electronically signed by: Bethann Nettles, MD 10/29/17 1655

## 2017-10-29 NOTE — Telephone Encounter (Signed)
 Advised pt to begin Liver Shrinking Diet tomorrow, 3/14. Pt will refer to her book for details, as she was driving while I was explaining the diet. Pt will call if she has questions.    Electronically signed by: Devere FORBES JENEANE Refugio, RD 10/29/17 1717

## 2017-12-10 NOTE — Progress Notes (Signed)
 Bariatric Post-op Established Visit   Assessment/Plan:  Patient is doing very well postoperatively after laparoscopic sleeve gastrectomy. Patient was counseled on continuing aerobic exercise for at least one hour day, and 3 days a week of weight resistance training. We also discussed making proper food choices, using lean meats, which are either baked, broiled or grilled. Avoid fried foods. Continue eating fruits and vegetables, stay away from bread's, rice, enriched or processed foods. No sodas. No sweet tea. Continue drinking water, crystallite as needed.   Patient was counseled on continued cardio, 5 days a week, at least an hour a day, along with weight resistance training, 3 days a week. We also discussed continuous use of fiber supplementation, adequate hydration and ambulation.    Additionally, the patient will likely have significant abdominal skin fold and pannus progression, and is already displaying signs of pannus skin irritation, and mild fungal/yeast infection. Counseled on continuing use of antifungal creams and lotions. We'll likely need an benefit from panniculectomy after weight loss has stopped and weight has stabilized.  We'll discuss plastics referral at that time.   Follow-up in 2 months for 3 month follow-up.    Subjective:  The patient is doing well after laparoscopic sleeve gastrectomy, performed 1 month ago.  Denies any fever or chills, or any other constitutional symptoms.  Patient is having bowel movements and tolerating bariatric diet, currently on modified bariatric diet.  No wound or abnormal pain issues.   Preoperative weight was 426lbs, weight today is 389.4lbs. Congratulated patient on weight loss.  Exercise: primarily walking  Medications: Current Outpatient Prescriptions  Medication Sig Dispense Refill  . ondansetron  (ZOFRAN -ODT) 4 MG disintegrating tablet Take 1 tablet by mouth every 8 (eight) hours as needed for Nausea. For post- op use 20 tablet 2  .  ursodiol (ACTIGALL) 300 mg capsule Take 1 capsule (300 mg total) by mouth 2 times daily for 30 days. (Patient taking differently: Take 300 mg by mouth 2 times daily. For use post op) 60 capsule 0  . HYDROcodone-acetaminophen  7.5-325 mg/15 mL solution Take 10 mls by mouth every 4 (four) hours as needed for pain. (For Post Op use) 420 mL 0   No current facility-administered medications for this visit.     Allergies Patient has no known allergies.  Problem List Active Problems:   * No active hospital problems. *  Objective:  BP 141/90   Pulse 80   Ht 1.651 m (5' 5)   Wt (!) 176.6 kg (389 lb 6.4 oz)   BMI 64.80 kg/m   Physical Exam: General: No acute distress. Abdomen: soft, non-tender, non-distended, no hernia or masses palpated.  Incisions: clean/dry/intact, no evidence of infection; no hernia noted. skin pannus noted, underside is moist, slightly red, irritated, likely mild fungal/yeast infection.    Signed, Electronically signed by: Laymon Almarie Gentry, PA-C 12/10/2017 10:25 AM PA-C 12/10/2017, 10:25 AM    Electronically signed by: Laymon Almarie Gentry, PA-C 12/10/17 [phone]

## 2018-04-13 NOTE — Progress Notes (Addendum)
 Bariatric Post-op Established Visit  Surgeon: Bethann Nettles MD  Patient Name: Samantha Wilkerson      MRN: [id number]      Date of Birth: 1981-07-27  Assessment/Plan:  Patient is doing very well postoperatively after laparoscopic sleeve gastrectomy.  Preoperative weight was 445 lbs, weight today is 350 lbs in 6 months. Congratulated patient on weight loss.   Patient was counseled on continuing aerobic exercise for at least one hour day, and 3 days a week of weight resistance training. We also discussed making proper food choices, using lean meats, which are either baked, broiled or grilled. Avoid fried foods. Continue eating heavy portions of fruits and vegetables, stay away from bread's, Rice, enriched or processed foods. No sodas. No sweet tea. Continue drinking water, crystallite as needed.   Additionally, the patient will likely have significant abdominal skin fold and pannus progression, and is already displaying signs of pannus skin irritation, and mild fungal/yeast infection. Counseled on continuing use of antifungal creams and lotions. We'll likely need an benefit from panniculectomy after weight loss has stopped and weight has stabilized.  We'll discuss plastics referral at that time.   Follow-up in 6 months.    Subjective:  The patient is doing well after laparoscopic sleeve gastrectomy, performed 6 months ago.  Denies any fever or chills, or any other constitutional symptoms.  Patient is having bowel movements and tolerating bariatric diet, currently on bariatric diet and following some bariatric principles.  She states she is eating a lot of protein, and tries to avoid pasta, bread etc.  She is eating a lot of pickled vegetables like yogurt and banana peppers.  I warned her about some of the higher fats like oils and butter in some of the pickled vegetable.  She also admits she is eating fast food or fried food once every 2 weeks, and I told her to avoid this.  Additionally she is drinking  sweet tea.  I recommended she switch to unsweetened tea, with a 0-calorie sweetener.  She will make these changes.  Additionally she is not working out at all.  I recommended she work out 3-5 times a week, with a combination of cardio and weight resistance training.  Despite all this she has lost 94 pounds over the last 6 months.  With these changes in diet, and exercise I think she would do very well and continue to lose weight.  If she does not, she may see tapering off of her weight loss and we discussed this.  Patient was counseled on continued cardio, 5 days a week, at least an hour a day, along with weight resistance training, 3 days a week. We also discussed continuous use of fiber supplementation, adequate hydration and ambulation.  .  Medications: Current Outpatient Prescriptions  Medication Sig Dispense Refill  . esomeprazole (NEXIUM) 40 MG capsule Take 40 mg by mouth daily.    SABRA HYDROcodone-acetaminophen  7.5-325 mg/15 mL solution Take 10 mls by mouth every 4 (four) hours as needed for pain. (For Post Op use) 420 mL 0  . ondansetron  (ZOFRAN -ODT) 4 MG disintegrating tablet Take 1 tablet by mouth every 8 (eight) hours as needed for Nausea. For post- op use 20 tablet 2  . ursodiol (ACTIGALL) 300 mg capsule Take 1 capsule (300 mg total) by mouth 2 times daily for 30 days. (Patient taking differently: Take 300 mg by mouth 2 times daily. For use post op) 60 capsule 0   No current facility-administered medications for this visit.  Problem List Active Problems:   * No active hospital problems. *   MBSAQIP Data Base  Suspected or confirmed DVT or PE? No   Incisional hernia? No   BPAP or CPAP for OSA? No   GERD treatment? Yes   Lipid lowering medication? No   Hypertension medication? No Please List:    Diabetes medication? No Please List:     Objective:  BP 163/93   Pulse 78   Resp 18   Ht 1.651 m (5' 5)   Wt (!) 159.2 kg (350 lb 14.4 oz)   SpO2 98%   BMI 58.39 kg/m    Physical Exam: General: No acute distress. Abdomen: soft, non-tender, non-distended, no hernia or masses palpated.  Incisions: clean/dry/intact, no evidence of infection; no hernia noted. skin pannus noted, underside is moist, slightly red, irritated, likely mild fungal/yeast infection.   Electronically signed by: Bethann Nettles, MD 04/13/18 1704    Electronically signed by: Bethann Nettles, MD 04/14/18 [phone]

## 2018-07-24 NOTE — Progress Notes (Signed)
 Samantha Wilkerson - 37 y.o. MRN [id number]  Patient's last menstrual period was 06/03/2018. [id number]  Chief Complaint  Patient presents with  . New Patient    New pt is present for annual. Abortion on 07/04/2018. Pt has bleedng through 07/17/2018. Pap: 06/08/2010. BC: NONE.     HPI: Pt presents for annual exam.   Patient reports having an abortion 07/04/2018.  She stopped bleeding about a week ago.  Patient has not had a Pap smear in a number of years. Lat pap was abnl and pt had colpo with bx but no treatment.  Needs BC. Does have HTN and morbid obesity Does have some boils under arms and groin intermittently C/o vaginal odor but no burning itching or discharge.  The odor is intermittent.    Health Maintenance Status      Date Due Completion Dates   Cervical Cancer Screening 3 years January 21, 1981 ---   INFLUENZA VACCINE 05/20/2019 (Originally 03/19/2018) 05/08/2017     :  Current Outpatient Medications  Medication Sig Dispense Refill  . amLODIPine (NORVASC) 10 MG tablet Take 1 tablet (10 mg total) by mouth daily. 90 tablet 1  . cyanocobalamin (VITAMIN B12) 250 MCG tablet Take 250 mcg by mouth daily.    SABRA esomeprazole (NEXIUM) 40 MG capsule Take 40 mg by mouth daily.    . multivitamin capsule Take 1 capsule by mouth daily.     No current facility-administered medications for this visit.     No Known Allergies  Social History   Socioeconomic History  . Marital status: Single    Spouse name: Not on file  . Number of children: 3  . Years of education: 60  . Highest education level: Not on file  Occupational History  . Occupation: asst Interior and spatial designer  Social Needs  . Financial resource strain: Not on file  . Food insecurity:    Worry: Not on file    Inability: Not on file  . Transportation needs:    Medical: Not on file    Non-medical: Not on file  Tobacco Use  . Smoking status: Former Smoker    Packs/day: 0.00    Types: Cigarettes    Last attempt to quit: 2015    Years since  quitting: 4.9  . Smokeless tobacco: Never Used  Substance and Sexual Activity  . Alcohol use: Yes    Alcohol/week: 15.0 oz    Types: 3 Glasses of wine (5 oz./glass) per week    Frequency: 2-3 times a week    Drinks per session: 1 or 2    Binge frequency: Less than monthly  . Drug use: No  . Sexual activity: Yes    Partners: Male    Birth control/protection: Condom  Lifestyle  . Physical activity:    Days per week: Not on file    Minutes per session: Not on file  . Stress: Not on file  Relationships  . Social connections:    Talks on phone: Not on file    Gets together: Not on file    Attends religious service: Not on file    Active member of club or organization: Not on file    Attends meetings of clubs or organizations: Not on file    Relationship status: Not on file  Other Topics Concern  . Not on file  Social History Narrative  . Not on file    Family History  Problem Relation Age of Onset  . Hypertension Mother   . COPD Father   .  Breast cancer Maternal Aunt   . Bone cancer Maternal Aunt   . Glaucoma Neg Hx   . Macular degeneration Neg Hx     Past Medical History:  Diagnosis Date  . Abnormal Pap smear of cervix   . Acid reflux   . Back pain   . Hearing loss    bilateral  . Hiatal hernia    small per EGD  . Hypertension   . OSA (obstructive sleep apnea) 10/27/2017   HST 10/26/17, AHI 14.3 mild per patient no CPAP    Past Surgical History:  Procedure Laterality Date  . CESAREAN SECTION    . COLPOSCOPY    . ESOPHAGOSCOPY / EGD N/A 07/02/2017   Procedure: EGD;  Surgeon: Vanderbilt Krystal Pan, MD;  Location: [facility];  Service: General;  Laterality: N/A;  . INDUCED ABORTION    . LAPAROSCOPIC SLEEVE GASTRECTOMY N/A 11/13/2017   Procedure: SLEEVE GASTRECTOMY LAPAROSCOPIC;  Surgeon: Bethann Nettles, MD;  Location: [facility];  Service: General;  Laterality: N/A;  . TYMPANOSTOMY TUBE PLACEMENT      MND:MZCPZT OF SYSTEMS:  GENERAL: no fevers, chills,  significant changes in weight  HEENT: no changes in vision or hearing, no rhinorrhea or cough BREAST: no pain, masses or discharge RESPIRATORY: no cough, increased work of breathing or shortness of breath CARDIOVASCULAR: no chest pain or palpitations GASTROINTESTINAL: no N/V/D, no abdominal or pelvic pain, regular bowel movements GENITOURINARY: no dysuria, no vaginal discharge, no bowel or bladder incontinence NEUROLOGICAL: no headaches, memory loss or weakness MUSCULOSKELETAL: no bone or joint pain SKIN: no rashes or lesions PSYCHIATRY: no changes in mood, no anxiety or depression.  AUTOIMMUNE/HEMEONC: No problems reported  BP 130/88   Ht 5' 4.75 (1.645 m)   Wt (!) 323 lb (146.5 kg)   LMP 06/03/2018   BMI 54.17 kg/m    PHYSICAL EXAM    General appearance: well nourished, well hydrated, no acute distress, morbidly obese. Neck: supple, no masses, trachea midline Thyroid: no nodules, masses, tenderness, or enlargement Breast Exam:   Breast inspection: no asymmetry, skin changes, or nipple discharge Breast palpation: no masses or tenderness Respiratory:  no rales, rhonchi, or wheezes Cardiovascular:  Auscultation: S1, S2, no murmur, rub, or gallop Gastrointestinal:  Abdomen: soft, non-tender, no masses, bowel sounds normal, no CVA tenderness Liver and spleen: no enlargement or nodularity Hernia: no hernias Genitourinary:  External genitalia: Normal, no lesions or discharge.  Normal Bartholin's and Skene's glands Urethra: no discharge Bladder: well supported Vagina:  Without lesions or prolapse, kegels well. Cervix:  Normal appearance, without lesions or discharge Uterus:  Normal sized, mobile, nontender.  Adnexa: no masses or tenderness No perianal lesions or masses Lymphatic:  Neck: no cervical adenopathy Axillae: no axillary adenopathy Groin: no inguinal adenopathy Misc. lymph nodes: no other adenopathy  Skin Inspection: no rashes, lesions, or ulcerations Mental  Status Exam:  Judgment, insight: intact Orientation: oriented to time, place, and person Memory: intact for recent and remote events Mood and affect: no depression, anxiety, or agitation     IMPRESSION/PLAN:  1. Encounter for gynecological examination (general) (routine) without abnormal findings    2. Cervical cancer screening  Ambulatory Referral To Obstetrics / Gynecology  3. Vaginal discharge  AMB POCT Wet Prep  4. Essential hypertension    5. Morbid obesity (HCC)    Continue yearly Paps since an abnormal Pap with colpo in 2011.  Patient has not had a Pap smear since. Discussed various birth control options including Depo-Provera , Micronor , Nexplanon,  the IUD both Kyleena and ParaGard.  Patient is leaning towards the Nexplanon and will go ahead and signed the paperwork today.  She will let us  know if she wants to proceed. Patient will see the dermatologist for the boils under the arm and groin which were not present today. We will do wet smear for the vaginal odor.    Well woman exam: Self breast exam encouraged Calcium and Vitamin D requirements reviewed Exercise:  Encouraged at least 2 1/2 hours per week   Electronically signed by: Manuelita Murray Pizza, MD 07/24/18 1011

## 2021-05-17 ENCOUNTER — Other Ambulatory Visit: Payer: Self-pay

## 2021-05-17 ENCOUNTER — Emergency Department: Payer: 59

## 2021-05-17 ENCOUNTER — Emergency Department
Admission: EM | Admit: 2021-05-17 | Discharge: 2021-05-18 | Disposition: A | Discharge status: Home / Self Care | Payer: 59 | Attending: Emergency Medicine | Admitting: Emergency Medicine

## 2021-05-17 DIAGNOSIS — R799 Abnormal finding of blood chemistry, unspecified: Secondary | ICD-10-CM | POA: Diagnosis present

## 2021-05-17 DIAGNOSIS — N939 Abnormal uterine and vaginal bleeding, unspecified: Secondary | ICD-10-CM | POA: Insufficient documentation

## 2021-05-17 DIAGNOSIS — D649 Anemia, unspecified: Secondary | ICD-10-CM | POA: Insufficient documentation

## 2021-05-17 DIAGNOSIS — Z87891 Personal history of nicotine dependence: Secondary | ICD-10-CM | POA: Diagnosis not present

## 2021-05-17 DIAGNOSIS — R102 Pelvic and perineal pain: Secondary | ICD-10-CM | POA: Insufficient documentation

## 2021-05-17 LAB — URINALYSIS, MICROSCOPIC (REFLEX)
Squamous Epithelial / HPF: >50 (ref 0–5)
WBC, UA: >50 WBC/hpf (ref 0–5)

## 2021-05-17 LAB — RETICULOCYTES
Immature Retic Fract: 36.8 % — ABNORMAL HIGH (ref 2.3–15.9)
RBC.: 3.75 MIL/uL — ABNORMAL LOW (ref 3.87–5.11)
Retic Count, Absolute: 43.9 10*3/uL (ref 19.0–186.0)
Retic Ct Pct: 1.2 % (ref 0.4–3.1)

## 2021-05-17 LAB — URINALYSIS, ROUTINE W REFLEX MICROSCOPIC
Bilirubin Urine: NEGATIVE
Glucose, UA: NEGATIVE mg/dL
Hgb urine dipstick: NEGATIVE
Ketones, ur: NEGATIVE mg/dL
Nitrite: NEGATIVE
Protein, ur: 30 mg/dL — AB
Specific Gravity, Urine: >1.03 — ABNORMAL HIGH (ref 1.005–1.030)
pH: 5.5 (ref 5.0–8.0)

## 2021-05-17 LAB — POC URINE PREG, ED: Preg Test, Ur: NEGATIVE

## 2021-05-17 LAB — PREPARE RBC (CROSSMATCH)

## 2021-05-17 LAB — ABO/RH: ABO/RH(D): AB POS

## 2021-05-17 MED ORDER — MEDROXYPROGESTERONE ACETATE 5 MG PO TABS: 5.0000 mg | ORAL_TABLET | Freq: Every day | ORAL | 0 refills | Status: DC

## 2021-05-17 MED ORDER — SODIUM CHLORIDE 0.9 % IV SOLN
10.0000 mL/h | Freq: Once | INTRAVENOUS | Status: AC
  Administered 2021-05-17: 10 mL/h via INTRAVENOUS

## 2021-05-17 NOTE — ED Provider Notes (Signed)
HPI: Pt is a 40 y.o. female who presents with complaints of lower hemoglobin   The patient p/w  low hemoglobin. Otherwise feels fine. No dark tarry stools. Heavy menstruation   ROS: Denies fever, chest pain, vomiting  Past Medical History:  Diagnosis Date   Abnormal Pap smear 2010   performed a colposcopy   Hypertension    Gestational   Vitals:   05/17/21 1611  BP: 136/69  Pulse: 85  Resp: 16  Temp: 98.7 F (37.1 C)  SpO2: 100%    Focused Physical Exam: Gen: No acute distress Head: atraumatic, normocephalic Eyes: Extraocular movements grossly intact; conjunctiva clear CV: RRR Lung: No increased WOB, no stridor GI: ND, no obvious masses Neuro: Alert and awake  Medical Decision Making and Plan: Given the patient's initial medical screening exam, the following diagnostic evaluation has been ordered. The patient will be placed in the appropriate treatment space, once one is available, to complete the evaluation and treatment. I have discussed the plan of care with the patient and I have advised the patient that an ED physician or mid-level practitioner will reevaluate their condition after the test results have been received, as the results may give them additional insight into the type of treatment they may need.   Diagnostics: labs/US   Treatments: none immediately   Vanessa [place], MD 05/17/21 1711

## 2021-05-17 NOTE — ED Notes (Signed)
Sent blood sample for ABORh confirmation and reticulocyte count

## 2021-05-17 NOTE — ED Triage Notes (Signed)
Pt sent by PCP for Hbg of 6.8. states she has been having heavier, longer menstrual cycles since the beginning of the year. Pt denies any pain or SOB

## 2021-05-17 NOTE — ED Provider Notes (Signed)
[facility] Emergency Department Provider Note  ____________________________________________   Event Date/Time   First MD Initiated Contact with Patient 05/17/21 1927     (approximate)  I have reviewed the triage vital signs and the nursing notes.   HISTORY  Chief Complaint Abnormal Lab   HPI Samantha Wilkerson is a 40 y.o. female with a past medical history of gestational hypertension who presents to the emergency room after routine blood work obtained earlier today at her routine PCP visit showed anemia with a hemoglobin of 6.8.  Patient states she has never been anemic before although does not her menstrual periods have been heavier than usual over the last 3 to 4 months.  She states that they have been lasting 5 to 7 days which is longer than usual 3-4.  She states she is not currently bleeding.  She denies any symptoms including including headache, earache, sore throat, chest pain, cough, shortness of breath, abdominal pain, back pain, extremity pain, weakness, numbness, paresthesias, burning with urination, abnormal vaginal discharge or any black or bloody stools.  She does not take NSAIDs or blood thinners.  She has no history of any coagulopathies.         Past Medical History:  Diagnosis Date   Abnormal Pap smear 2010   performed a colposcopy   Hypertension    Gestational    Patient Active Problem List   Diagnosis Date Noted   Routine postpartum follow-up 01/08/2013   Back pain with radiation 01/08/2013   Insufficient prenatal care 10/19/2012   Obesity 76/73/4193   Obesity complicating pregnancy in third trimester 10/19/2012   Sterilization consult 10/19/2012    Past Surgical History:  Procedure Laterality Date   CESAREAN SECTION N/A 12/07/2012   Procedure: CESAREAN SECTION;  Surgeon: Mora Bellman, MD;  Location: [facility];  Service: Obstetrics;  Laterality: N/A;   INNER EAR SURGERY      Prior to Admission medications   Medication Sig  Start Date End Date Taking? Authorizing Provider  medroxyPROGESTERone (PROVERA) 5 MG tablet Take 1 tablet (5 mg total) by mouth daily. 05/17/21 06/16/21 Yes Lucrezia Starch, MD  docusate sodium (COLACE) 100 MG capsule Take 1 capsule (100 mg total) by mouth 2 (two) times daily as needed for constipation. 12/10/12   Gerda Diss, DO  ibuprofen (ADVIL,MOTRIN) 600 MG tablet Take 1 tablet (600 mg total) by mouth every 6 (six) hours. 01/08/13   Woodroe Mode, MD  omeprazole (PRILOSEC OTC) 20 MG tablet Take 20 mg by mouth daily.    [provider]  oxyCODONE-acetaminophen (PERCOCET/ROXICET) 5-325 MG per tablet Take 1-2 tablets by mouth every 4 (four) hours as needed. 12/10/12   Gerda Diss, DO  oxyCODONE-acetaminophen (PERCOCET/ROXICET) 5-325 MG per tablet Take 1 tablet by mouth every 4 (four) hours as needed for pain. 12/14/12   Emily Filbert, MD  Prenatal Vit-Fe Fumarate-FA (MULTIVITAMIN-PRENATAL) 27-0.8 MG TABS Take 1 tablet by mouth daily at 12 noon.    [provider]    Allergies Patient has no known allergies.  Family History  Problem Relation Age of Onset   Diabetes Mother    Hypertension Mother    Cancer Maternal Aunt        breast to bone    Social History Social History   Tobacco Use   Smoking status: Former    Types: Cigarettes    Quit date: 04/08/2012    Years since quitting: 9.1   Smokeless tobacco: Never  Substance  Use Topics   Alcohol use: No   Drug use: No    Review of Systems  Review of Systems  Constitutional:  Positive for malaise/fatigue. Negative for chills and fever.  HENT:  Negative for sore throat.   Eyes:  Negative for pain.  Respiratory:  Negative for cough and stridor.   Cardiovascular:  Negative for chest pain.  Gastrointestinal:  Negative for vomiting.  Genitourinary:  Negative for dysuria.  Musculoskeletal:  Negative for myalgias.  Skin:  Negative for rash.  Neurological:  Negative for seizures, loss of consciousness and  headaches.  Psychiatric/Behavioral:  Negative for suicidal ideas.   All other systems reviewed and are negative.    ____________________________________________   PHYSICAL EXAM:  VITAL SIGNS: ED Triage Vitals [05/17/21 1611]  Enc Vitals Group     BP 136/69     Pulse Rate 85     Resp 16     Temp 98.7 F (37.1 C)     Temp Source Oral     SpO2 100 %     Weight 252 lb (114.3 kg)     Height 5\' 5"  (1.651 m)     Head Circumference      Peak Flow      Pain Score 0     Pain Loc      Pain Edu?      Excl. in [place]?    Vitals:   05/17/21 1611 05/17/21 2015  BP: 136/69 135/71  Pulse: 85 73  Resp: 16 18  Temp: 98.7 F (37.1 C) 98.1 F (36.7 C)  SpO2: 100% 100%   Physical Exam Vitals and nursing note reviewed.  Constitutional:      General: She is not in acute distress.    Appearance: She is well-developed.  HENT:     Head: Normocephalic and atraumatic.     Right Ear: External ear normal.     Left Ear: External ear normal.     Nose: Nose normal.  Eyes:     Conjunctiva/sclera: Conjunctivae normal.  Cardiovascular:     Rate and Rhythm: Normal rate and regular rhythm.     Heart sounds: No murmur heard. Pulmonary:     Effort: Pulmonary effort is normal. No respiratory distress.     Breath sounds: Normal breath sounds.  Abdominal:     Palpations: Abdomen is soft.     Tenderness: There is no abdominal tenderness.  Musculoskeletal:     Cervical back: Neck supple.  Skin:    General: Skin is warm and dry.     Capillary Refill: Capillary refill takes less than 2 seconds.  Neurological:     Mental Status: She is alert and oriented to person, place, and time.  Psychiatric:        Mood and Affect: Mood normal.     ____________________________________________   LABS (all labs ordered are listed, but only abnormal results are displayed)  Labs Reviewed  URINALYSIS, ROUTINE W REFLEX MICROSCOPIC - Abnormal; Notable for the following components:      Result Value    APPearance CLOUDY (*)    Specific Gravity, Urine >1.030 (*)    Protein, ur 30 (*)    Leukocytes,Ua MODERATE (*)    All other components within normal limits  URINALYSIS, MICROSCOPIC (REFLEX) - Abnormal; Notable for the following components:   Bacteria, UA MANY (*)    All other components within normal limits  RETICULOCYTES - Abnormal; Notable for the following components:   RBC. 3.75 (*)    Immature  Retic Fract 36.8 (*)    All other components within normal limits  IRON AND TIBC  POC URINE PREG, ED  TYPE AND SCREEN  PREPARE RBC (CROSSMATCH)  ABO/RH   ____________________________________________  EKG  ____________________________________________  RADIOLOGY  ED MD interpretation: Pelvic ultrasound remarkable for large submucosal fibroid in the right posterior body of uterus at 5.5 cm.  No other acute pelvic process noted.  Official radiology report(s): US PELVIC COMPLETE WITH TRANSVAGINAL  Result Date: 05/17/2021 CLINICAL DATA:  Heavy menses.  Low hemoglobin. EXAM: TRANSABDOMINAL AND TRANSVAGINAL ULTRASOUND OF PELVIS TECHNIQUE: Both transabdominal and transvaginal ultrasound examinations of the pelvis were performed. Transabdominal technique was performed for global imaging of the pelvis including uterus, ovaries, adnexal regions, and pelvic cul-de-sac. It was necessary to proceed with endovaginal exam following the transabdominal exam to visualize the uterus and ovaries. COMPARISON:  None FINDINGS: Uterus Measurements: 8.9 by 7.3 x 7.0 cm = volume: 2.37 mL. Large right posterior sub mucosal fibroid is identified measuring 5.5 x 4.6 x 5.1 cm. Endometrium Thickness: 16 mm.  No focal abnormality visualized. Right ovary Measurements: 3.6 x 2.2 x 2.2 cm = volume: 9 mL. Normal appearance/no adnexal mass. Left ovary Measurements: 2.6 x 2.4 x 2.9 cm = volume: 10 mL. Normal appearance/no adnexal mass. Other findings Small volume of free fluid noted IMPRESSION: 1. No acute findings. 2. Large  submucosal fibroid is identified within the right posterior body of the uterus measuring 5.5 cm. 3. Small volume of free fluid within the pelvis is noted which may be physiologic in a premenopausal female. Electronically Signed   By: Kerby Moors M.D.   On: 05/17/2021 18:13    ____________________________________________   PROCEDURES  Procedure(s) performed (including Critical Care):  .Critical Care Performed by: Lucrezia Starch, MD Authorized by: Lucrezia Starch, MD   Critical care provider statement:    Critical care time (minutes):  45   Critical care was necessary to treat or prevent imminent or life-threatening deterioration of the following conditions:  Circulatory failure   Critical care was time spent personally by me on the following activities:  Discussions with consultants, evaluation of patient's response to treatment, examination of patient, ordering and performing treatments and interventions, ordering and review of laboratory studies, ordering and review of radiographic studies, pulse oximetry, re-evaluation of patient's condition, obtaining history from patient or surrogate and review of old charts   ____________________________________________   INITIAL IMPRESSION / [place] / ED COURSE      Patient presents with above-stated history exam for assessment of some asymptomatic anemia seen on routine lab work done at PCP earlier today.  Patient reports history of unusual menstrual bleeding the last couple months but no current bleeding and is denying any symptoms associated with this.  In addition she is denying any black melanotic or bloody stools and has not had any injuries or other recent bleeding.  No prior similar episodes  History is most concerning for likely anemia secondary to heavy menstrual bleeding.  Discussed recommendation for rectal exam for Hemoccult testing of stool she declines this at this time.   I was able to review labs drawn earlier  today and the CBC showed hemoglobin of 6.8.  Platelets are 216 and WBC count is 4.8.  CMP unremarkable.  TSH unremarkable.  Iron panel consistent with iron deficiency anemia.  Pelvic ultrasound remarkable for large submucosal fibroid in the right posterior body of uterus at 5.5 cm.  No other acute pelvic process noted.  Is  certainly possible her bleeding is related to this fibroid versus polyp versus from hormonal imbalance.  Lower suspicion for acute coagulopathy.  Given hemoglobin below 7 I did consent patient for transfusion of 1 unit PRBCs.  Discussed concerns at this is likely secondary to her heavy menstrual bleeding and likely subacute to chronic in nature.  Offered patient admission for observation and further evaluation although she states she strongly prefers to go home and follow-up outpatient.  She does not currently have an appointment with her OB/GYN until November and so I spoke with on-call OB/GYN Dr. Amalia Hailey who said he could probably have patient follow-up in his clinic in 2 or 3 weeks.  I think this is much more reasonable.  I will start patient on progesterone only OCPs and iron supplementation.  Following blood transfusion patient was discharged with instructions to follow-up with her OB/GYN in the next 1 to 2 weeks.  She is to return immediately to emergency room for any acute emptiness.      ____________________________________________   FINAL CLINICAL IMPRESSION(S) / ED DIAGNOSES  Final diagnoses:  Vaginal bleeding  Anemia, unspecified type    Medications  0.9 %  sodium chloride infusion (has no administration in time range)     ED Discharge Orders          Ordered    medroxyPROGESTERone (PROVERA) 5 MG tablet  Daily        05/17/21 2054             Note:  This document was prepared using Dragon voice recognition software and may include unintentional dictation errors.    Lucrezia Starch, MD 05/17/21 2056

## 2021-05-17 NOTE — Discharge Instructions (Addendum)
It is vital that you start taking iron supplementation as soon as you are able to.  Recommend starting over-the-counter medication when you pick up your prescription for medroxyprogesterone.

## 2021-05-17 NOTE — ED Notes (Addendum)
Pt seen in room AAOx4, in NAD. ERMD at bedside at this time . Per ERMD plan to discharge pt post 1unit blood transfusion in ED

## 2021-05-18 ENCOUNTER — Telehealth: Payer: Self-pay | Admitting: Obstetrics and Gynecology

## 2021-05-18 LAB — TYPE AND SCREEN
ABO/RH(D): AB POS
Antibody Screen: NEGATIVE
Unit division: 0

## 2021-05-18 LAB — BPAM RBC
Blood Product Expiration Date: 202211022359
ISSUE DATE / TIME: 202209292057
Unit Type and Rh: 6200

## 2021-05-18 NOTE — Telephone Encounter (Signed)
Patient is an ER follow up - pt is suppose to be seen with 2-3 weeks. Where can I place this patient so they can be seen?

## 2021-06-05 ENCOUNTER — Encounter: Payer: Self-pay | Admitting: Obstetrics and Gynecology

## 2021-06-05 ENCOUNTER — Ambulatory Visit (INDEPENDENT_AMBULATORY_CARE_PROVIDER_SITE_OTHER): Payer: 59 | Admitting: Obstetrics and Gynecology

## 2021-06-05 ENCOUNTER — Other Ambulatory Visit: Payer: Self-pay

## 2021-06-05 VITALS — BP 146/86 | HR 89 | Ht 65.0 in | Wt 252.8 lb

## 2021-06-05 DIAGNOSIS — N920 Excessive and frequent menstruation with regular cycle: Secondary | ICD-10-CM | POA: Diagnosis not present

## 2021-06-05 DIAGNOSIS — D219 Benign neoplasm of connective and other soft tissue, unspecified: Secondary | ICD-10-CM | POA: Diagnosis not present

## 2021-06-05 MED ORDER — NORETHINDRONE 0.35 MG PO TABS: 1.0000 | ORAL_TABLET | Freq: Every day | ORAL | 1 refills | Status: DC

## 2021-06-05 NOTE — Progress Notes (Signed)
HPI:      [name] is a 40 y.o. [id number] who LMP was Patient's last menstrual period was 05/21/2021.  Subjective:   She presents today with complaint of very heavy vaginal bleeding over the last 9 months.  She states that she has regular cycles but they are often very heavy.  Prior to that she reports her cycles were monthly and not nearly as heavy.  She recently had hemoglobin drawn which showed a significant decrease.  In addition she underwent ultrasound which revealed a submucosal fibroid. She was given progesterone and her bleeding stopped but she is no longer taking the progesterone.  She is not bleeding at this time. She uses condoms for birth control. She would like to discuss her options today regarding work-up, treatment and follow-up for her uterine fibroid.    Hx: The following portions of the patient's history were reviewed and updated as appropriate:             She  has a past medical history of Abnormal Pap smear (2010) and Hypertension. She does not have any pertinent problems on file. She  has a past surgical history that includes Inner ear surgery and Cesarean section (N/A, 12/07/2012). Her family history includes Cancer in her maternal aunt; Diabetes in her mother; Hypertension in her mother. She  reports that she quit smoking about 9 years ago. Her smoking use included cigarettes. She has never used smokeless tobacco. She reports that she does not drink alcohol and does not use drugs. She has a current medication list which includes the following prescription(s): medroxyprogesterone, norethindrone, omeprazole, and docusate sodium. She has No Known Allergies.       Review of Systems:  Review of Systems  Constitutional: Denied constitutional symptoms, night sweats, recent illness, fatigue, fever, insomnia and weight loss.  Eyes: Denied eye symptoms, eye pain, photophobia, vision change and visual disturbance.  Ears/Nose/Throat/Neck: Denied ear, nose, throat or neck  symptoms, hearing loss, nasal discharge, sinus congestion and sore throat.  Cardiovascular: Denied cardiovascular symptoms, arrhythmia, chest pain/pressure, edema, exercise intolerance, orthopnea and palpitations.  Respiratory: Denied pulmonary symptoms, asthma, pleuritic pain, productive sputum, cough, dyspnea and wheezing.  Gastrointestinal: Denied, gastro-esophageal reflux, melena, nausea and vomiting.  Genitourinary: See HPI for additional information.  Musculoskeletal: Denied musculoskeletal symptoms, stiffness, swelling, muscle weakness and myalgia.  Dermatologic: Denied dermatology symptoms, rash and scar.  Neurologic: Denied neurology symptoms, dizziness, headache, neck pain and syncope.  Psychiatric: Denied psychiatric symptoms, anxiety and depression.  Endocrine: Denied endocrine symptoms including hot flashes and night sweats.   Meds:   Current Outpatient Medications on File Prior to Visit  Medication Sig Dispense Refill   medroxyPROGESTERone (PROVERA) 5 MG tablet Take 1 tablet (5 mg total) by mouth daily. 30 tablet 0   omeprazole (PRILOSEC OTC) 20 MG tablet Take 20 mg by mouth daily.     docusate sodium (COLACE) 100 MG capsule Take 1 capsule (100 mg total) by mouth 2 (two) times daily as needed for constipation. (Patient not taking: Reported on 06/05/2021) 10 capsule 0   No current facility-administered medications on file prior to visit.      Objective:     Vitals:   06/05/21 1031  BP: (!) 146/86  Pulse: 89   Filed Weights   06/05/21 1031  Weight: 252 lb 12.8 oz (114.7 kg)              Ultrasound results reviewed directly with the patient  Assessment:    [id number] Patient Active Problem List   Diagnosis Date Noted   Routine postpartum follow-up 01/08/2013   Back pain with radiation 01/08/2013   Insufficient prenatal care 10/19/2012   Obesity 32/54/9826   Obesity complicating pregnancy in third trimester 10/19/2012   Sterilization consult 10/19/2012      1. Fibroids   2. Menorrhagia with regular cycle     Large submucosal fibroid likely the source of her very heavy menstrual bleeding and subsequent blood loss causing a decline in her hemoglobin.  Up until 9 months ago the patient's regular monthly menstrual periods were "normal".   Plan:            1.  We have discussed multiple methods of bleeding control with uterine fibroids.  The risk benefits of each were discussed in detail.  We specifically discussed hormonal treatments with pills including OCPs and progesterone only pills.  We discussed use of IUD and why it may not be appropriate for a submucosal fibroid.  We discussed endometrial ablation.  We discussed uterine artery embolization.  And finally we discussed the possibility of future hysterectomy.  She would like some time to think about her options but would also like to attempt to control her cycles with monthly use of progesterone OCPs (Micronor). If her bleeding continues to be irregular I have recommended an endometrial biopsy. Once the patient has decided on a method of cycle control we can head in that direction.  She will inform us if anything changes. Plan follow-up in 3 months. Orders No orders of the defined types were placed in this encounter.    Meds ordered this encounter  Medications   norethindrone (MICRONOR) 0.35 MG tablet    Sig: Take 1 tablet (0.35 mg total) by mouth daily.    Dispense:  90 tablet    Refill:  1      F/U  Return in about 3 months (around 09/05/2021). I spent 33 minutes involved in the care of this patient preparing to see the patient by obtaining and reviewing her medical history (including labs, imaging tests and prior procedures), documenting clinical information in the electronic health record (EHR), counseling and coordinating care plans, writing and sending prescriptions, ordering tests or procedures and in direct communicating with the patient and medical staff discussing pertinent  items from her history and physical exam.  Finis Bud, M.D. 06/05/2021 12:56 PM

## 2021-06-18 NOTE — Progress Notes (Signed)
 Pt is here for ER follow-up. Pt declines flu vaccine. Pt states that she has her FMLA paperwork that needs to be completed.    Electronically signed by: Slater Darice Sole, MD 06/18/21 [phone]

## 2021-08-06 ENCOUNTER — Encounter: Payer: Self-pay | Admitting: Obstetrics and Gynecology

## 2021-08-30 NOTE — Progress Notes (Signed)
 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  Referred to hand surgery due to severity.  She has not been bracing or doing the conservative measures very well, so I advised her on this.  She is taking her vitamin D and will follow-up with her PCP for this.  She will follow-up with me as needed in the future.     Electronically signed by: Juliene Lonni Moores, MD 08/30/21 1104

## 2021-09-05 ENCOUNTER — Encounter: Payer: Self-pay | Admitting: Obstetrics and Gynecology

## 2021-09-05 ENCOUNTER — Other Ambulatory Visit: Payer: Self-pay

## 2021-09-05 ENCOUNTER — Ambulatory Visit: Payer: 59 | Admitting: Obstetrics and Gynecology

## 2021-09-05 VITALS — BP 170/97 | HR 74 | Ht 65.0 in | Wt 243.0 lb

## 2021-09-05 DIAGNOSIS — N920 Excessive and frequent menstruation with regular cycle: Secondary | ICD-10-CM | POA: Diagnosis not present

## 2021-09-05 DIAGNOSIS — D219 Benign neoplasm of connective and other soft tissue, unspecified: Secondary | ICD-10-CM | POA: Diagnosis not present

## 2021-09-05 NOTE — Progress Notes (Signed)
HPI:      [name] is a 41 y.o. [id number] who LMP was Patient's last menstrual period was 08/23/2021 (approximate).  Subjective:   She presents today for follow-up of bleeding with uterine fibroids.  Patient has taken progesterone only OCPs and states that over the last 3 months she bled every day but about 8.  She has since stopped the Micronor.  She is not bleeding at this time.  She is ready to explore other options for treatment of her bleeding and uterine fibroids. (One of the fibroids is some mucosal)     Hx: The following portions of the patient's history were reviewed and updated as appropriate:             She  has a past medical history of Abnormal Pap smear (2010) and Hypertension. She does not have any pertinent problems on file. She  has a past surgical history that includes Inner ear surgery and Cesarean section (N/A, 12/07/2012). Her family history includes Cancer in her maternal aunt; Diabetes in her mother; Hypertension in her mother. She  reports that she has been smoking cigars. She has never used smokeless tobacco. She reports that she does not drink alcohol and does not use drugs. She has a current medication list which includes the following prescription(s): esomeprazole, ferrous sulfate, omeprazole, vitamin d (ergocalciferol), docusate sodium, medroxyprogesterone, and norethindrone. She has No Known Allergies.       Review of Systems:  Review of Systems  Constitutional: Denied constitutional symptoms, night sweats, recent illness, fatigue, fever, insomnia and weight loss.  Eyes: Denied eye symptoms, eye pain, photophobia, vision change and visual disturbance.  Ears/Nose/Throat/Neck: Denied ear, nose, throat or neck symptoms, hearing loss, nasal discharge, sinus congestion and sore throat.  Cardiovascular: Denied cardiovascular symptoms, arrhythmia, chest pain/pressure, edema, exercise intolerance, orthopnea and palpitations.  Respiratory: Denied pulmonary  symptoms, asthma, pleuritic pain, productive sputum, cough, dyspnea and wheezing.  Gastrointestinal: Denied, gastro-esophageal reflux, melena, nausea and vomiting.  Genitourinary: See HPI for additional information.  Musculoskeletal: Denied musculoskeletal symptoms, stiffness, swelling, muscle weakness and myalgia.  Dermatologic: Denied dermatology symptoms, rash and scar.  Neurologic: Denied neurology symptoms, dizziness, headache, neck pain and syncope.  Psychiatric: Denied psychiatric symptoms, anxiety and depression.  Endocrine: Denied endocrine symptoms including hot flashes and night sweats.   Meds:   Current Outpatient Medications on File Prior to Visit  Medication Sig Dispense Refill   esomeprazole (NEXIUM) 40 MG capsule Take by mouth.     ferrous sulfate 325 (65 FE) MG EC tablet Take 1 tablet by mouth daily with breakfast.     omeprazole (PRILOSEC OTC) 20 MG tablet Take 20 mg by mouth daily.     Vitamin D, Ergocalciferol, (DRISDOL) 1.25 MG (50000 UNIT) CAPS capsule Take 50,000 Units by mouth once a week.     docusate sodium (COLACE) 100 MG capsule Take 1 capsule (100 mg total) by mouth 2 (two) times daily as needed for constipation. (Patient not taking: Reported on 06/05/2021) 10 capsule 0   medroxyPROGESTERone (PROVERA) 5 MG tablet Take 1 tablet (5 mg total) by mouth daily. 30 tablet 0   norethindrone (MICRONOR) 0.35 MG tablet Take 1 tablet (0.35 mg total) by mouth daily. (Patient not taking: Reported on 09/05/2021) 90 tablet 1   No current facility-administered medications on file prior to visit.      Objective:     Vitals:   09/05/21 0952  BP: (!) 170/97  Pulse: 74   Filed Weights   09/05/21  9150  Weight: 243 lb (110.2 kg)              Multiple uterine fibroids noted at ultrasound-please see report.          Assessment:    [id number] Patient Active Problem List   Diagnosis Date Noted   Routine postpartum follow-up 01/08/2013   Back pain with radiation  01/08/2013   Insufficient prenatal care 10/19/2012   Obesity 01/65/5374   Obesity complicating pregnancy in third trimester 10/19/2012   Sterilization consult 10/19/2012     1. Fibroids   2. Menorrhagia with regular cycle     Patient continued to have bleeding despite use of daily progesterone.  She is not interested in definitive management with hysterectomy and would like to explore other possibilities of treatment.   Plan:            1.  We previously discussed uterine fibroid embolization as well as endometrial ablation.  Patient is most interested in UFE.  Literature given.  Patient plans to make an appointment and pursue UFE.  Orders No orders of the defined types were placed in this encounter.   No orders of the defined types were placed in this encounter.     F/U  Return for Annual Physical. I spent 24 minutes involved in the care of this patient preparing to see the patient by obtaining and reviewing her medical history (including labs, imaging tests and prior procedures), documenting clinical information in the electronic health record (EHR), counseling and coordinating care plans, writing and sending prescriptions, ordering tests or procedures and in direct communicating with the patient and medical staff discussing pertinent items from her history and physical exam.  Finis Bud, M.D. 09/05/2021 10:28 AM

## 2021-09-06 ENCOUNTER — Telehealth: Payer: Self-pay | Admitting: Obstetrics and Gynecology

## 2021-09-06 NOTE — Telephone Encounter (Signed)
Pt is calling in wanting to check the status of a referral for a UFE and she is aware that someone from our office will give her a call back to let her know the details of the referral.

## 2021-09-06 NOTE — Telephone Encounter (Signed)
Spoke with pt - she reached out to [facility]- they are requiring a referral for patient.

## 2021-09-07 ENCOUNTER — Other Ambulatory Visit: Payer: Self-pay | Admitting: Obstetrics and Gynecology

## 2021-09-07 DIAGNOSIS — D219 Benign neoplasm of connective and other soft tissue, unspecified: Secondary | ICD-10-CM

## 2021-10-16 NOTE — Consults (Signed)
 History of present illness:    Referring provider: Dr. Alyse, MD  Reason for consultation: Moderate to severe iron deficiency anemia  Samantha Wilkerson is a 41 y.o. female who is being seen in consultation as a new patient on 10/16/2021 for evaluation and management of moderate to severe iron deficiency anemia.  After reviewing the medical records and entering the patient, it appears that Samantha Wilkerson has been anemic since September 2022 when she was found to have a hemoglobin of 6.8 g/dL with Red cell microcytosis.  The white blood cell count and platelet counts were normal.  Hemoglobin levels on 05/27/2018 was normal.  Serum ferritin and iron studies completed on 06/18/2021 has confirmed iron deficiency anemia.  When she was found to be severely anemic, she received transfusion of 1 unit of packed red blood cells and was started on oral iron supplementation.  She underwent bariatric surgery in 2019 for morbid obesity (gastric sleeve).  She has never received IV iron treatments.  Samantha Wilkerson denies any history of malignancies and has never received chemotherapy or radiation therapy treatments.  She has a history of uterine fibroids with resultant menorrhagia and is awaiting to undergo transarterial embolization procedure as recommended by her gynecologist.  Samantha Wilkerson is single and has 3 children.  She smokes tobacco and drinks alcohol in moderation.  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  She does not use any recreational drugs.  The patient's mother has a history of anemia.  There is no family history of bleeding disorders.  Samantha Wilkerson is experiencing heavy menstrual cycle which is lasted for over 2 weeks.  She has been taking the iron supplementation intermittently and denies any intolerance to iron products.  She has been experiencing fatigue and pica symptoms.  She denies any angina, chest pain or shortness of breath.  She denies any history suggestive of hematemesis, melena or passing  bright red blood per rectum.  An upper GI endoscopy completed on 07/04/2021 did not reveal any upper GI ulceration or AV malformations or malignancies.  She denies any other systemic symptoms.  The following portions of the patient's history were reviewed and updated as appropriate: allergies, current medications, past family history, past medical history, past social history, past surgical history and problem list.  Past Medical History:  Diagnosis Date  . Abnormal Pap smear of cervix   . Acid reflux   . Back pain   . Conductive hearing loss, bilateral 10/11/2016  . Essential hypertension 05/27/2017  . Eustachian tube dysfunction, bilateral 10/11/2016  . Gastroesophageal reflux disease without esophagitis 05/27/2017  . Hearing loss    bilateral  . Hiatal hernia    small per EGD  . Hypertension   . Morbid obesity (HCC) 05/27/2017  . OSA (obstructive sleep apnea) 10/27/2017   HST 10/26/17, AHI 14.3 mild per patient no CPAP   Social History   Socioeconomic History  . Marital status: Single    Spouse name: Not on file  . Number of children: 3  . Years of education: 53  . Highest education level: Not on file  Occupational History  . Occupation: asst Interior and spatial designer  Tobacco Use  . Smoking status: Every Day    Packs/day: 0.00    Types: Cigars, Cigarettes    Last attempt to quit: 2015    Years since quitting: 8.1  . Smokeless tobacco: Never  . Tobacco comments:    Black&Mild cigars  Substance and Sexual Activity  . Alcohol use: Yes  Alcohol/week: 25.0 standard drinks    Types: 3 Glasses of wine (5 oz./glass) per week  . Drug use: No  . Sexual activity: Yes    Partners: Male    Birth control/protection: Condom  Other Topics Concern  . Not on file  Social History Narrative  . Not on file   Social Determinants of Health   Financial Resource Strain: Not on file  Food Insecurity: Not on file  Transportation Needs: Not on file  Physical Activity: Not on file  Stress: Not on file   Social Connections: Not on file  Housing Stability: Not on file   The patient has a family history of  Current Outpatient Medications:  .  cyanocobalamin (VITAMIN B12) 250 MCG tablet, Take 1 tablet (250 mcg total) by mouth daily., Disp: , Rfl:  .  ergocalciferol (VITAMIN D2) 1,250 mcg (50,000 unit) capsule, Take 1 capsule (50,000 Units total) by mouth every 7 days., Disp: 12 capsule, Rfl: 0 .  esomeprazole (NEXIUM) 40 MG capsule, Take 1 capsule (40 mg total) by mouth daily., Disp: , Rfl:  .  ferrous sulfate 325 (65 FE) MG EC tablet, Take 1 tablet (325 mg total) by mouth daily with breakfast., Disp: 90 tablet, Rfl: 1 .  metroNIDAZOLE (FLAGYL) 500 MG tablet, 1 tab po bid, Disp: 14 tablet, Rfl: 0 .  multivitamin capsule, Take 1 capsule by mouth daily., Disp: , Rfl:  .  norethindrone -ethinyl estradiol (NECON 0.5/35) 0.5-35 mg-mcg per tablet, Take 1 tablet by mouth daily., Disp: , Rfl:   Review of Systems A complete ROS was performed with pertinent positives/negatives noted in the HPI. The remainder of the ROS are negative.      Objective:   Vitals:   10/16/21 1446  BP: (!) 163/105  Pulse: 74  Temp: 98.1 F (36.7 C)  Resp: 20  SpO2: 100%  PainSc: 0-Zero  Weight: 113.4 kg (250 lb)     General Appearance:    Alert, cooperative, no distress, appears stated age  Head:    Normocephalic, without obvious abnormality, atraumatic  Eyes:    PERRL, conjunctiva/corneas clear, EOM's intact, fundi    benign, both eyes, pallor noted to exam  Ears:    Normal TM's and external ear canals, both ears  Nose:   Nares normal, septum midline, mucosa normal, no drainage    or sinus tenderness  Throat:   Lips, mucosa, and tongue normal; teeth and gums normal  Neck:   Supple, symmetrical, trachea midline, no adenopathy;    thyroid:  no enlargement/tenderness/nodules; no carotid   bruit or JVD  Back:     Symmetric, no curvature, ROM normal, no CVA tenderness  Lungs:     Clear to auscultation  bilaterally, respirations unlabored  Chest Wall:    No tenderness or deformity   Heart:    Regular rate and rhythm, S1 and S2 normal, no murmur, rub   or gallop  Breast Exam:    Not done today  Abdomen:     Soft, non-tender, bowel sounds active all four quadrants,    no masses, no organomegaly  Genitalia:    Not done today  Rectal:    Not done today  Extremities:   Extremities normal, atraumatic, no cyanosis or edema  Pulses:   2+ and symmetric all extremities  Skin:   Skin color, texture, turgor normal, no rashes or lesions  Lymph nodes:   Cervical, supraclavicular, and axillary nodes are not enlarged to palpation  Neurologic:  The patient is awake,  alert and orientated to time, place and person without any focalizing findings.    Labs:  Results for orders placed or performed in visit on 10/16/21  CBC and Differential  Result Value Ref Range   WBC 3.4 (L) 4.4 - 11.0 x 10*3/uL   RBC 4.09 (L) 4.10 - 5.10 x 10*6/uL   Hemoglobin 8.0 (L) 12.3 - 15.3 G/DL   Hematocrit 72.4 (L) 64.0 - 44.6 %   MCV 67.3 (L) 80.0 - 96.0 FL   MCH 19.6 (L) 27.5 - 33.2 PG   MCHC 29.2 (L) 33.0 - 37.0 G/DL   RDW 79.3 (H) 87.6 - 82.9 %   Platelets 300 150 - 450 X 10*3/uL   MPV 8.5 6.8 - 10.2 FL   Neutrophil % 54 %   Lymphocyte % 31 %   Monocyte % 12 %   Eosinophil % 2 %   Basophil % 1 %   Neutrophil Absolute 1.8 1.8 - 7.8 x 10*3/uL   Lymphocyte Absolute 1.1 1.0 - 4.8 x 10*3/uL   Monocyte Absolute 0.4 0.0 - 0.8 x 10*3/uL   Eosinophil Absolute 0.1 0.0 - 0.5 x 10*3/uL   Basophil Absolute 0.0 0.0 - 0.2 x 10*3/uL    Diagnosis:   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  Moderate to severe iron deficiency anemia: Ms. 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  She is awaiting to undergo what appears to be a transarterial embolization procedure for treatment of the  uterine fibroids in the next 1 to 2 weeks.  Since she is symptomatic and possibly has malabsorption of iron secondary to gastric surgery, she will benefit from IV iron treatments.  She will continue to remain on oral iron supplementation, add a tablet of vitamin C to improve iron absorption and will avoid drinking excessive quantities of tea.  She will also increase the amount of iron in her diet.  She is agreeable with this recommendations.  2.  Encounter for intravenous iron replacement therapy: Ms. Rando will be treated with intravenous iron sucrose totaling 1 g over 3 days starting 10/18/2021.  I have counseled the patient on the side effects associated with intravenous iron sucrose including the rare possibility of developing anaphylaxis.  Currently there are no contraindications for the patient to receive these treatments.  A total of 60 minutes was spent on patient care today.  Greater than 50% my time was spent both face-to-face and non-face-to-face with the patient, reviewing her medical records, completing physical examination, going over the causes of iron deficiency anemia, treatment options for iron deficiency anemia, side effects of intravenous iron sucrose, results of today's lab work and answering her questions.  I will see her in follow-up in 3 months.  This record has been created using Conservation officer, historic buildings. Errors have been sought and corrected, but may not always be located. Such creation errors do not reflect on the standard of medical care.      Electronically signed by: Gar JAYSON Clamp, MD 10/16/21 [phone]

## 2021-10-18 ENCOUNTER — Encounter: Payer: 59 | Admitting: Obstetrics and Gynecology

## 2021-10-18 DIAGNOSIS — Z1231 Encounter for screening mammogram for malignant neoplasm of breast: Secondary | ICD-10-CM

## 2021-10-18 DIAGNOSIS — Z124 Encounter for screening for malignant neoplasm of cervix: Secondary | ICD-10-CM

## 2021-10-18 DIAGNOSIS — Z0000 Encounter for general adult medical examination without abnormal findings: Secondary | ICD-10-CM

## 2021-10-19 NOTE — Unmapped External Note (Signed)
 Pt tolerated infusion well.   Discharged via _ambulatory____________  With __self____________  To return  ___Monday_____________     Electronically signed by: Arland LELON Cleverly, RN 10/19/21 [phone]

## 2021-12-06 ENCOUNTER — Encounter: Payer: 59 | Admitting: Obstetrics and Gynecology

## 2021-12-06 DIAGNOSIS — Z01419 Encounter for gynecological examination (general) (routine) without abnormal findings: Secondary | ICD-10-CM

## 2021-12-06 DIAGNOSIS — D219 Benign neoplasm of connective and other soft tissue, unspecified: Secondary | ICD-10-CM

## 2021-12-06 DIAGNOSIS — Z124 Encounter for screening for malignant neoplasm of cervix: Secondary | ICD-10-CM

## 2021-12-06 DIAGNOSIS — Z1231 Encounter for screening mammogram for malignant neoplasm of breast: Secondary | ICD-10-CM

## 2022-01-25 ENCOUNTER — Telehealth: Payer: Self-pay | Admitting: Obstetrics and Gynecology

## 2022-01-25 NOTE — Telephone Encounter (Signed)
Pt is missing Documentation pertaining to issue.

## 2022-01-29 ENCOUNTER — Encounter: Payer: Self-pay | Admitting: Obstetrics and Gynecology

## 2022-01-29 DIAGNOSIS — D219 Benign neoplasm of connective and other soft tissue, unspecified: Secondary | ICD-10-CM

## 2022-01-29 DIAGNOSIS — Z1231 Encounter for screening mammogram for malignant neoplasm of breast: Secondary | ICD-10-CM

## 2022-01-29 DIAGNOSIS — Z01419 Encounter for gynecological examination (general) (routine) without abnormal findings: Secondary | ICD-10-CM

## 2022-01-29 DIAGNOSIS — Z124 Encounter for screening for malignant neoplasm of cervix: Secondary | ICD-10-CM

## 2022-03-11 NOTE — Progress Notes (Signed)
 Subjective:  Patient ID: Samantha Wilkerson is a 41 y.o. female.  Chief Complaint  Patient presents with  . Respiratory     HPI  Mouth or throat complaint Patient presents with: sore throat   Location:  Generalized Quality:  Sore Onset Quality:  Sudden Severity:  Moderate Duration of current symptoms:  3 days Timing:  Gradually worsening Chronicity:  New Ineffective treatments: OTC cough medication  Response to treatments:  No relief Associated symptoms: chills, cough, fatigue, muffled voice, headache, congestion, ear pain, fever, neck pain, rhinorrhea, sore throat and swollen glands   Associated symptoms: no abdominal pain, no chest pain and no shortness of breath          Review of Systems  Constitutional: Positive for chills, fatigue and fever.  HENT: Positive for congestion, ear pain, rhinorrhea, sore throat and voice change.   Respiratory: Positive for cough. Negative for shortness of breath.   Cardiovascular: Negative.  Negative for chest pain.  Gastrointestinal: Positive for nausea and vomiting. Negative for abdominal pain.  Musculoskeletal: Positive for neck pain.  Skin: Negative.   Neurological: Negative.   All other systems reviewed and are negative.   Social History   Tobacco Use  Smoking Status Some Days  . Types: Cigars  . Passive exposure: Current  Smokeless Tobacco Never   History reviewed. No pertinent past medical history. Past Surgical History:  Procedure Laterality Date  . CESAREAN SECTION    . EAR TUBE REMOVAL    . UTERINE FIBROID SURGERY     History reviewed. No pertinent family history. Objective:      Physical Exam Constitutional:      Appearance: Normal appearance.  HENT:     Head: Normocephalic.     Right Ear: Tympanic membrane, ear canal and external ear normal.     Left Ear: Tympanic membrane, ear canal and external ear normal.     Nose: Congestion present. No rhinorrhea.     Mouth/Throat:     Mouth: Mucous membranes are moist.      Pharynx: Uvula midline. Pharyngeal swelling and posterior oropharyngeal erythema present. No oropharyngeal exudate.     Tonsils: Tonsillar exudate present. 1+ on the right. 2+ on the left.  Eyes:     Conjunctiva/sclera: Conjunctivae normal.  Cardiovascular:     Rate and Rhythm: Regular rhythm. Tachycardia present.     Pulses: Normal pulses.     Heart sounds: Normal heart sounds.  Pulmonary:     Effort: Pulmonary effort is normal.     Breath sounds: Normal breath sounds. No wheezing or rhonchi.  Chest:     Chest wall: No tenderness.  Abdominal:     Tenderness: There is no abdominal tenderness.  Musculoskeletal:     Cervical back: Normal range of motion. Tenderness present.  Lymphadenopathy:     Cervical: Cervical adenopathy present.  Skin:    General: Skin is warm and dry.  Neurological:     Mental Status: She is alert and oriented to person, place, and time.  Psychiatric:        Mood and Affect: Mood normal.        Behavior: Behavior normal.       Assessment/Plan:  HPI provided by Self  Based on today's visit:history, physical exam and all relevant testing completed in clinic today patient's visit diagnosis is/includes  1. Tonsillitis with exudate   2. Acute pharyngitis, unspecified etiology   3. Contact with and (suspected) exposure to covid-19   4. Encounter for observation for suspected  exposure to other biological agents ruled out    Patient has a history of chronic conditions and those listed in the visit diagnoses were reviewed today. They are currently stable on medications.  Treatment plan includes:  Orders Placed: Orders Placed This Encounter  Procedures  . Strep Molecular POCT  . LumiraDX SARS-COV-2 Rapid Result Antigen Test   Medications ordered this visit   Signed Prescriptions Disp Refills  . azithromycin (ZITHROMAX) 250 MG tablet 6 tablet 0    Sig: Take 2 tablets (500 mg total) by mouth daily for 1 day, THEN 1 tablet (250 mg total) daily for 4  days.    Current medication list and any new medications prescribed or recommended today were reviewed with the patient and specific instructions were provided Yes  Provider Recommendations   Advised patient to take Tylenol / Ibuprofen  as needed for inflammation and symptom relief.  Salt water gargles  Hot/cold beverages for soothing  Vocal rest  Drink plenty of fluids   Today you were seen for bacterial infection. Take antibiotics as prescribed with food and complete antibiotic therapy despite potential early resolution of symptoms. Drink fluids to thin secretions. Antibiotic therapy lessens the effects of birth control, therefore backup contraception reviewed. Signs that you need to follow up sooner include chest pain, shortness of breath, nausea/vomiting, syncope, headaches, fever/chills, abdominal pain, and/or rash. Follow up within 5-7 days if no symptom improvement. If allergic reaction occurs with medication, stop taking the medication and consult with clinic/ or report to ER based on severity.            Follow up care instructions were provided and reviewed?with the  Patient. All questions were answered. Patient verbalized understanding of plan of care today.                                             I am having Samantha Wilkerson start on azithromycin. COVID Oral Antiviral Eligibility Status: Does not meet eligibility: Due to negative test  [place] Department of Health notified of results per regulations

## 2023-01-16 DIAGNOSIS — H20041 Secondary noninfectious iridocyclitis, right eye: Secondary | ICD-10-CM | POA: Diagnosis not present

## 2023-06-16 NOTE — Progress Notes (Signed)
  [facility] Visit Note    Samantha Wilkerson is a 42 y.o. female who presents with self.   History obtained from patient.   Assessment & Plan:    Assessment & Plan Rib pain on right side  Orders: .  ibuprofen  (MOTRIN ) 800 MG tablet; Take 1 tablet (800 mg total) by mouth every 8 (eight) hours as needed for pain (pain) for up to 5 days    No follow-ups on file.  Rest, ice, heat, NSAID  Follow up with urgent care for further evaluation to rule out rib fractures     Subjective     Pt fell at top of stairs inside her house 2 days ago   Tried to catch the wall with her arms and missed it and fell on her right chest   Pt in a lot of pain, hx of BP, pt has BP med and will take today , her dr recently took her off of this Reevaluate once pain is under control    MSKPatient presents with:  Injury and pain Chronicity:  New Onset:  2 days History of trauma: Yes   Mechanism of Injury:  Other Progression since onset:  Gradually worsening Affected location(s):  Upper body Laterality:  On the right Quality/Characterisitics:  Aching (tight) Associated symptoms: tingling (digit numbness, is being treated for this though, this is not new; no changes in tingling with the fall)   Associated symptoms: not non-weight bearing, no joint instability, no decreased active range of motion and not numbness   Aggravated factor:  Activity Treatments tried:  Rest Improvement on treatment:  None    No Known Allergies  Review of Systems   Objective     Vital Signs: BP (!) 145/87   Pulse (!) 101   Temp 98.5 F (36.9 C) (Tympanic)   Resp 20   Ht 5' 5 (1.651 m)   Wt 250 lb (113 kg)   LMP 05/23/2023   SpO2 99%   BMI 41.60 kg/m    Physical Exam Vitals and nursing note reviewed.  Constitutional:      General: She is not in acute distress.    Appearance: Normal appearance. She is not ill-appearing.  HENT:     Head: Normocephalic and atraumatic.  Pulmonary:     Effort: Pulmonary effort  is normal.  Chest:       Comments: Tender to palpate in whole region, guarding is present, no bruising No lesions no s/s of infection  Abdominal:     General: Abdomen is flat. Bowel sounds are normal. There is no distension. There are no signs of injury.     Palpations: Abdomen is soft. There is no fluid wave, hepatomegaly or splenomegaly.     Tenderness: There is no abdominal tenderness. There is guarding. Negative signs include Murphy's sign and McBurney's sign.  Skin:    General: Skin is warm and dry.  Neurological:     Mental Status: She is alert and oriented to person, place, and time.  Psychiatric:        Mood and Affect: Mood normal.        Behavior: Behavior normal.

## 2023-09-10 IMAGING — US US PELVIS COMPLETE WITH TRANSVAGINAL
1 series · 13 of 25 positions shown · non-contrast
Comparison: None

CLINICAL DATA: Heavy menses.  Low hemoglobin.

EXAM:
TRANSABDOMINAL AND TRANSVAGINAL ULTRASOUND OF PELVIS
TECHNIQUE: Both transabdominal and transvaginal ultrasound examinations of the
pelvis were performed. Transabdominal technique was performed for
global imaging of the pelvis including uterus, ovaries, adnexal
regions, and pelvic cul-de-sac. It was necessary to proceed with
endovaginal exam following the transabdominal exam to visualize the
uterus and ovaries.

[Series 1: us pelvic complete with transvaginal · 13 of 67 slices shown]
[im 1/67]
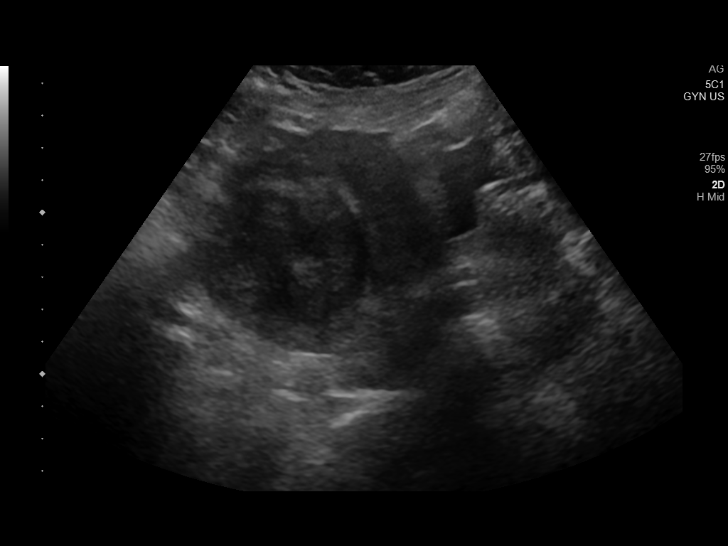
[im 6/67]
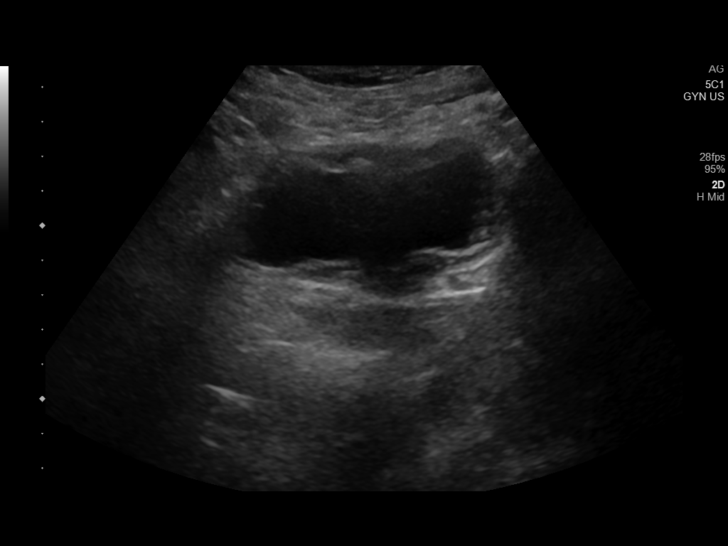
[im 12/67]
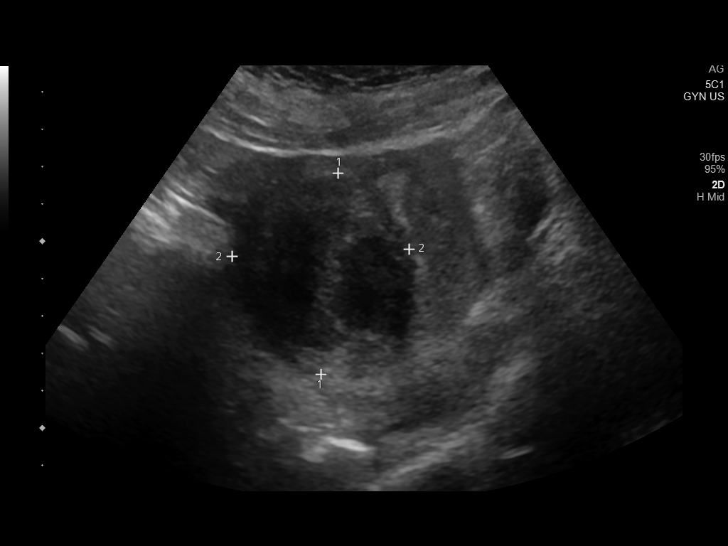
[im 17/67]
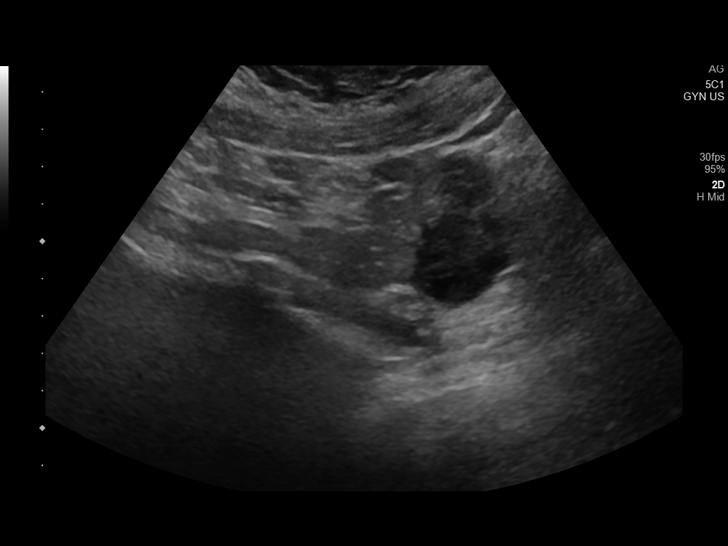
[im 23/67]
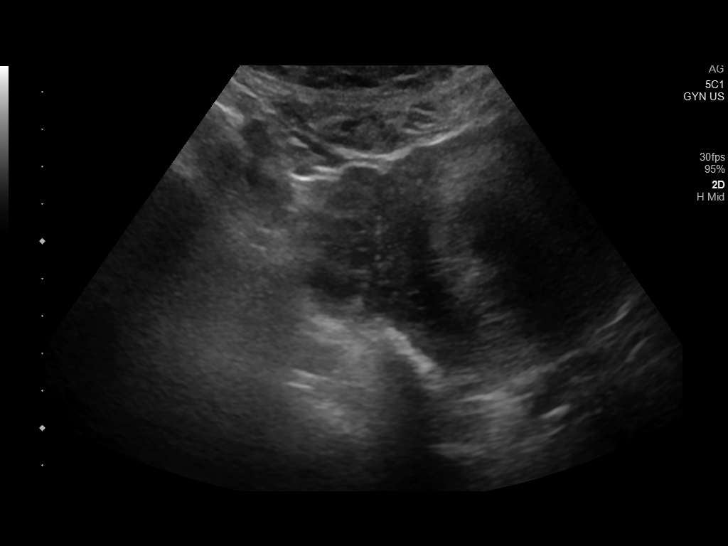
[im 28/67]
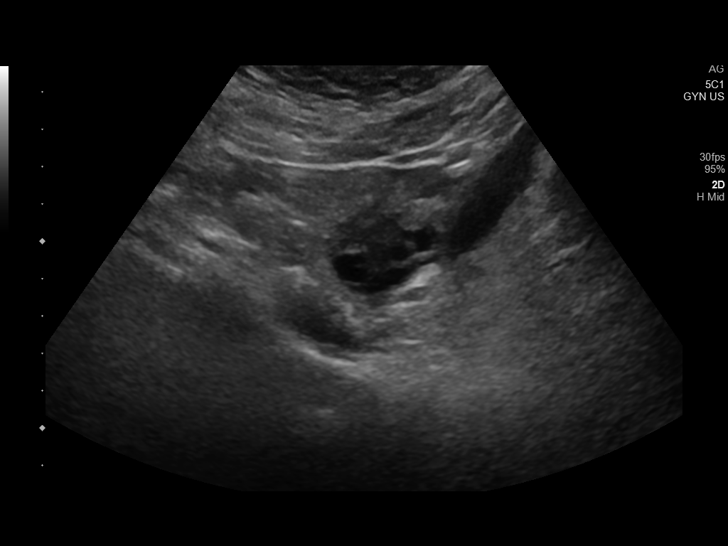
[im 34/67]
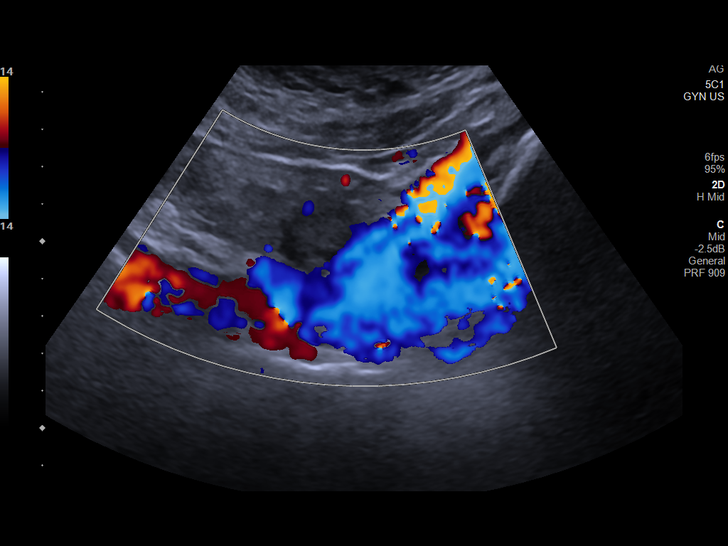
[im 39/67]
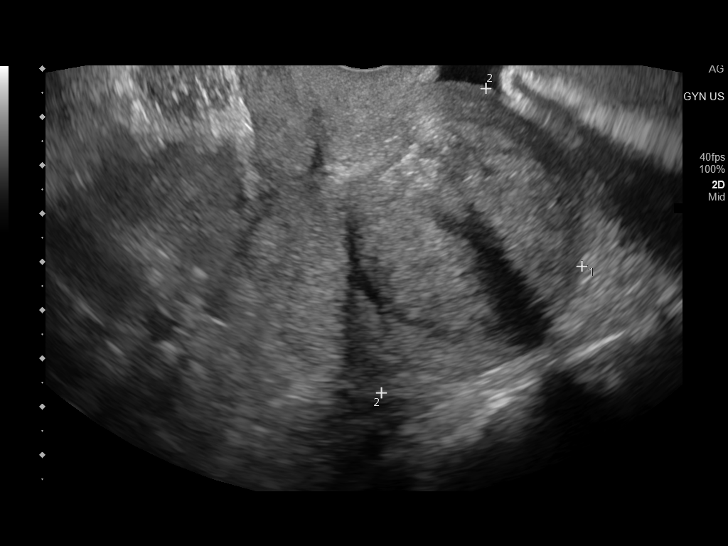
[im 45/67]
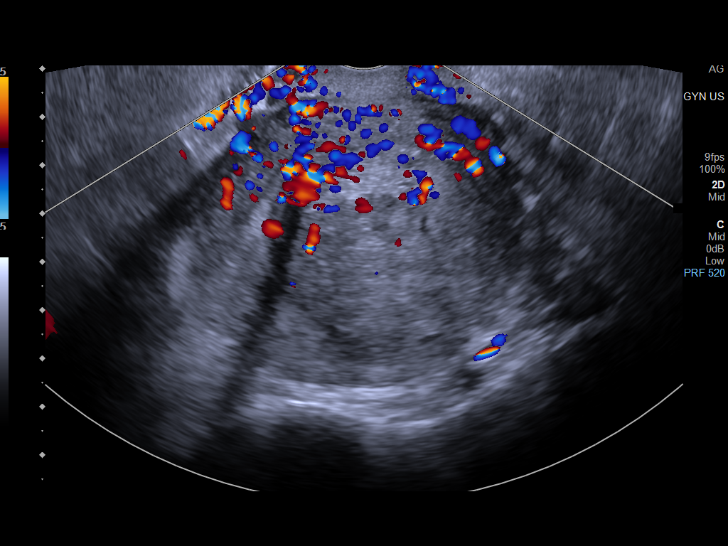
[im 50/67]
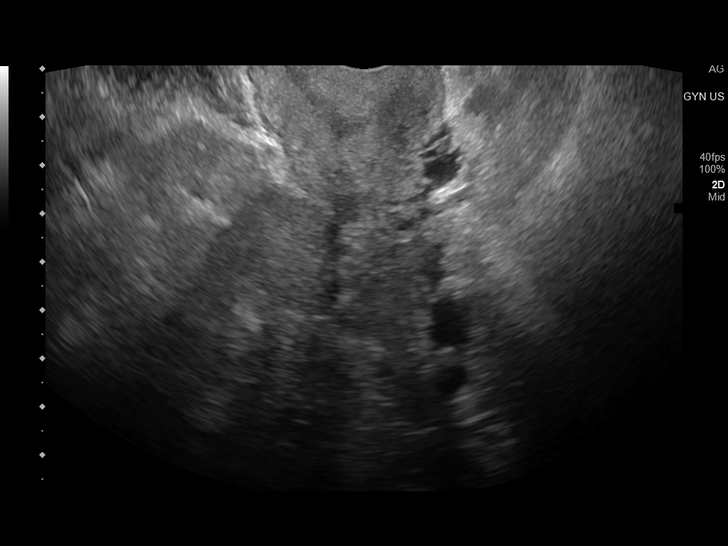
[im 56/67]
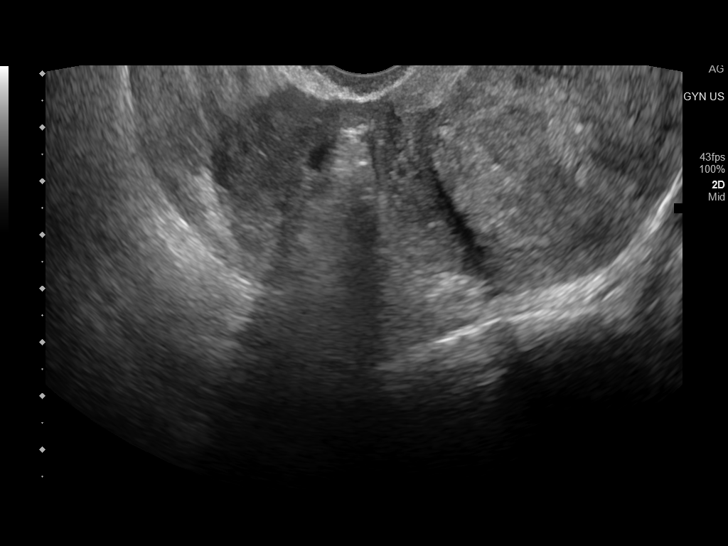
[im 61/67]
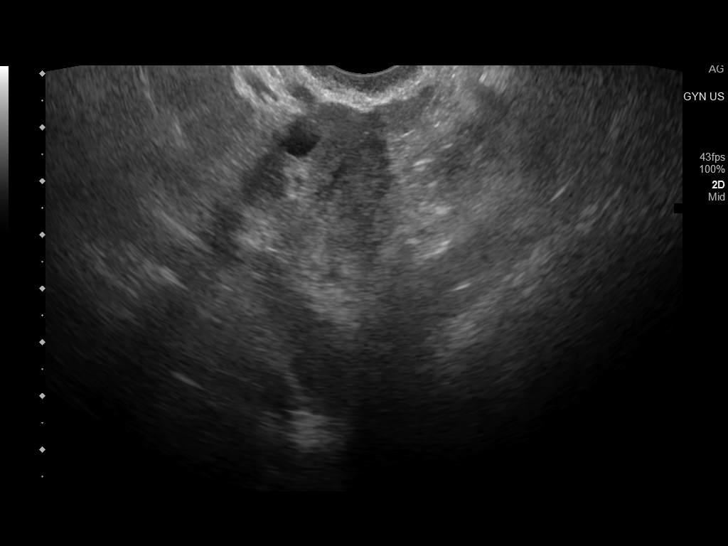
[im 67/67]
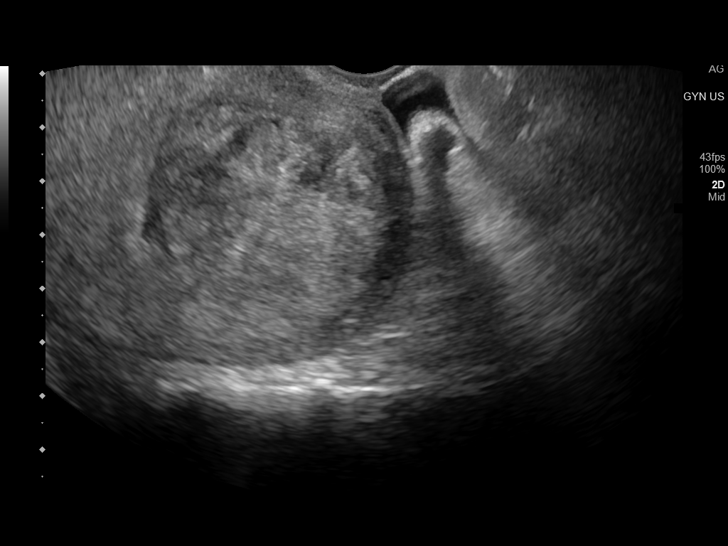

[13 of 25 positions shown; findings below may reference images not displayed]

FINDINGS: Uterus

Measurements: 8.9 by 7.3 x 7.0 cm = volume: 2.37 mL. Large right
posterior sub mucosal fibroid is identified measuring 5.5 x 4.6 x
5.1 cm.

Endometrium

Thickness: 16 mm.  No focal abnormality visualized.

Right ovary

Measurements: 3.6 x 2.2 x 2.2 cm = volume: 9 mL. Normal
appearance/no adnexal mass.

Left ovary

Measurements: 2.6 x 2.4 x 2.9 cm = volume: 10 mL. Normal
appearance/no adnexal mass.

Other findings

Small volume of free fluid noted
IMPRESSION: 1. No acute findings.
2. Large submucosal fibroid is identified within the right posterior
body of the uterus measuring 5.5 cm.
3. Small volume of free fluid within the pelvis is noted which may
be physiologic in a premenopausal female.

## 2023-10-03 ENCOUNTER — Other Ambulatory Visit: Payer: Self-pay

## 2023-10-03 ENCOUNTER — Encounter: Payer: Self-pay | Admitting: Internal Medicine

## 2023-10-03 ENCOUNTER — Ambulatory Visit: Payer: BC Managed Care – PPO | Admitting: Internal Medicine

## 2023-10-03 VITALS — BP 136/84 | HR 86 | Temp 98.1°F | Resp 16 | Ht 65.0 in | Wt 245.1 lb

## 2023-10-03 DIAGNOSIS — Z1231 Encounter for screening mammogram for malignant neoplasm of breast: Secondary | ICD-10-CM

## 2023-10-03 DIAGNOSIS — L732 Hidradenitis suppurativa: Secondary | ICD-10-CM

## 2023-10-03 DIAGNOSIS — R030 Elevated blood-pressure reading, without diagnosis of hypertension: Secondary | ICD-10-CM | POA: Insufficient documentation

## 2023-10-03 DIAGNOSIS — Z13220 Encounter for screening for lipoid disorders: Secondary | ICD-10-CM

## 2023-10-03 DIAGNOSIS — Z1159 Encounter for screening for other viral diseases: Secondary | ICD-10-CM

## 2023-10-03 DIAGNOSIS — Z9889 Other specified postprocedural states: Secondary | ICD-10-CM | POA: Insufficient documentation

## 2023-10-03 DIAGNOSIS — K224 Dyskinesia of esophagus: Secondary | ICD-10-CM | POA: Diagnosis not present

## 2023-10-03 DIAGNOSIS — K219 Gastro-esophageal reflux disease without esophagitis: Secondary | ICD-10-CM | POA: Diagnosis not present

## 2023-10-03 DIAGNOSIS — B351 Tinea unguium: Secondary | ICD-10-CM

## 2023-10-03 DIAGNOSIS — E559 Vitamin D deficiency, unspecified: Secondary | ICD-10-CM

## 2023-10-03 MED ORDER — PANTOPRAZOLE SODIUM 40 MG PO TBEC: 40.0000 mg | DELAYED_RELEASE_TABLET | Freq: Every day | ORAL | 3 refills | Status: DC

## 2023-10-03 MED ORDER — CHLORHEXIDINE GLUCONATE 4 % EX SOLN
Freq: Every day | CUTANEOUS | 0 refills | Status: AC | PRN
Start: 2023-10-03 — End: ?

## 2023-10-03 MED ORDER — CLINDAMYCIN PHOSPHATE 1 % EX GEL: Freq: Two times a day (BID) | CUTANEOUS | 0 refills | Status: AC

## 2023-10-03 NOTE — Assessment & Plan Note (Signed)
Will start treatment for GERD and refer to GI for ongoing esophogeal spasms.

## 2023-10-03 NOTE — Assessment & Plan Note (Signed)
Only on right foot, will check liver enzymes and prescribe Lamisil if everything is normal. Recheck in 3 months.

## 2023-10-03 NOTE — Assessment & Plan Note (Signed)
Prescribe hibiclens and topical clindamycin as needed.

## 2023-10-03 NOTE — Assessment & Plan Note (Signed)
Discontinue Nexium and Prilosec, start Protonix 40 mg daily, recheck in 3 months.

## 2023-10-03 NOTE — Assessment & Plan Note (Signed)
BP stable here, will discontinue Amlodipine but continue to monitor.

## 2023-10-03 NOTE — Assessment & Plan Note (Signed)
Recheck all vitamins, not currently on supplements.

## 2023-10-03 NOTE — Progress Notes (Signed)
New Patient Office Visit  Subjective    Patient ID: Samantha Wilkerson, female    DOB: [birth year]  Age: 43 y.o. MRN: [id number]  CC:  Chief Complaint  Patient presents with   Establish Care    HPI Samantha Wilkerson presents to establish care.  Hx of HTN: -Has an old prescription for Amlodipine 2.5 mg that she will take every once in awhile, not taking regularly   Hx of Gastric Sleeve: -s/p 2019, lost significant amount of weight -Not currently on any vitamins or supplements  GERD: -Currently on Nexium 40 mg daily but difficult to control symptoms   Esophageal Spasm: -Had an EGD in the past for dilatation but states she still has episodes where she feels like her chest is squeezing, will drink cold water and that can help  Health Maintenance: -Blood work due -Mammogram due -Pap due  Outpatient Encounter Medications as of 10/03/2023  Medication Sig   chlorhexidine (HIBICLENS) 4 % external liquid Apply topically daily as needed.   clindamycin (CLINDAGEL) 1 % gel Apply topically 2 (two) times daily.   pantoprazole (PROTONIX) 40 MG tablet Take 1 tablet (40 mg total) by mouth daily.   [DISCONTINUED] esomeprazole (NEXIUM) 40 MG capsule Take by mouth.   [DISCONTINUED] omeprazole (PRILOSEC OTC) 20 MG tablet Take 20 mg by mouth daily.   [DISCONTINUED] docusate sodium (COLACE) 100 MG capsule Take 1 capsule (100 mg total) by mouth 2 (two) times daily as needed for constipation. (Patient not taking: Reported on 10/03/2023)   [DISCONTINUED] ferrous sulfate 325 (65 FE) MG EC tablet Take 1 tablet by mouth daily with breakfast. (Patient not taking: Reported on 10/03/2023)   [DISCONTINUED] medroxyPROGESTERone (PROVERA) 5 MG tablet Take 1 tablet (5 mg total) by mouth daily.   [DISCONTINUED] norethindrone (MICRONOR) 0.35 MG tablet Take 1 tablet (0.35 mg total) by mouth daily. (Patient not taking: Reported on 09/05/2021)   [DISCONTINUED] Vitamin D, Ergocalciferol, (DRISDOL) 1.25 MG (50000 UNIT) CAPS  capsule Take 50,000 Units by mouth once a week. (Patient not taking: Reported on 10/03/2023)   No facility-administered encounter medications on file as of 10/03/2023.    Past Medical History:  Diagnosis Date   Abnormal Pap smear 08/19/2008   performed a colposcopy   Hypertension    Gestational   Sleep apnea     Past Surgical History:  Procedure Laterality Date   CESAREAN SECTION N/A 12/07/2012   Procedure: CESAREAN SECTION;  Surgeon: Catalina Antigua, MD;  Location: [facility];  Service: Obstetrics;  Laterality: N/A;   INNER EAR SURGERY     LAPAROSCOPIC GASTRIC SLEEVE RESECTION     TUBAL LIGATION      Family History  Problem Relation Age of Onset   Diabetes Mother    Hypertension Mother    Cancer Maternal Aunt        breast to bone    Social History   Socioeconomic History   Marital status: Single    Spouse name: Not on file   Number of children: Not on file   Years of education: Not on file   Highest education level: Not on file  Occupational History   Not on file  Tobacco Use   Smoking status: Every Day    Types: Cigars   Smokeless tobacco: Never  Vaping Use   Vaping status: Never Used  Substance and Sexual Activity   Alcohol use: Yes   Drug use: No   Sexual activity: Yes    Birth control/protection: None  Other Topics  Concern   Not on file  Social History Narrative   Not on file   Social Drivers of Health   Financial Resource Strain: Not on file  Food Insecurity: Not on file  Transportation Needs: Not on file  Physical Activity: Not on file  Stress: Not on file  Social Connections: Not on file  Intimate Partner Violence: Not on file    Review of Systems  All other systems reviewed and are negative.       Objective    BP 136/84 (Cuff Size: Large)   Pulse 86   Temp 98.1 F (36.7 C) (Oral)   Resp 16   Ht 5\' 5"  (1.651 m)   Wt 245 lb 1.6 oz (111.2 kg)   LMP 09/29/2023   SpO2 99%   BMI 40.79 kg/m   Physical Exam Constitutional:       Appearance: Normal appearance.  HENT:     Head: Normocephalic and atraumatic.     Mouth/Throat:     Mouth: Mucous membranes are moist.     Pharynx: Oropharynx is clear.  Eyes:     Extraocular Movements: Extraocular movements intact.     Conjunctiva/sclera: Conjunctivae normal.     Pupils: Pupils are equal, round, and reactive to light.  Neck:     Comments: No thyromegaly  Cardiovascular:     Rate and Rhythm: Normal rate and regular rhythm.  Pulmonary:     Effort: Pulmonary effort is normal.     Breath sounds: Normal breath sounds.  Musculoskeletal:     Cervical back: No tenderness.     Right lower leg: No edema.     Left lower leg: No edema.  Feet:     Right foot:     Skin integrity: Dry skin present.     Toenail Condition: Right toenails are abnormally thick.  Lymphadenopathy:     Cervical: No cervical adenopathy.  Skin:    General: Skin is warm and dry.  Neurological:     General: No focal deficit present.     Mental Status: She is alert. Mental status is at baseline.  Psychiatric:        Mood and Affect: Mood normal.        Behavior: Behavior normal.         Assessment & Plan:  History of gastric surgery Assessment & Plan: Recheck all vitamins, not currently on supplements.   Orders: -     CBC with Differential/Platelet -     COMPLETE METABOLIC PANEL WITH GFR -     W09 and Folate Panel -     Iron, TIBC and Ferritin Panel  Elevated blood pressure reading without diagnosis of hypertension Assessment & Plan: BP stable here, will discontinue Amlodipine but continue to monitor.    Gastroesophageal reflux disease, unspecified whether esophagitis present Assessment & Plan: Discontinue Nexium and Prilosec, start Protonix 40 mg daily, recheck in 3 months.  Orders: -     Pantoprazole Sodium; Take 1 tablet (40 mg total) by mouth daily.  Dispense: 30 tablet; Refill: 3  Esophageal spasm Assessment & Plan: Will start treatment for GERD and refer to GI for  ongoing esophogeal spasms.   Orders: -     Ambulatory referral to Gastroenterology  Hidradenitis suppurativa of left axilla Assessment & Plan: Prescribe hibiclens and topical clindamycin as needed.   Orders: -     Chlorhexidine Gluconate; Apply topically daily as needed.  Dispense: 473 mL; Refill: 0 -     Clindamycin Phosphate; Apply  topically 2 (two) times daily.  Dispense: 30 g; Refill: 0  Onychomycosis Assessment & Plan: Only on right foot, will check liver enzymes and prescribe Lamisil if everything is normal. Recheck in 3 months.    Lipid screening -     Lipid panel  Need for hepatitis C screening test -     Hepatitis C antibody  Encounter for screening mammogram for malignant neoplasm of breast -     3D Screening Mammogram, Left and Right; Future  Vitamin D deficiency -     VITAMIN D 25 Hydroxy (Vit-D Deficiency, Fractures)  Screening labs ordered, mammogram ordered.   Return in about 3 months (around 12/31/2023).   Margarita Mail, DO

## 2023-12-01 ENCOUNTER — Other Ambulatory Visit: Payer: Self-pay | Admitting: Internal Medicine

## 2023-12-01 DIAGNOSIS — K219 Gastro-esophageal reflux disease without esophagitis: Secondary | ICD-10-CM

## 2023-12-02 NOTE — Telephone Encounter (Signed)
 Requested Prescriptions  Refused Prescriptions Disp Refills   pantoprazole (PROTONIX) 40 MG tablet [Pharmacy Med Name: PANTOPRAZOLE SOD DR 40 MG TAB] 90 tablet 2    Sig: TAKE 1 TABLET BY MOUTH EVERY DAY     Gastroenterology: Proton Pump Inhibitors Passed - 12/02/2023 12:22 PM      Passed - Valid encounter within last 12 months    Recent Outpatient Visits           2 months ago History of gastric surgery   [facility] Rockney Cid, DO       Future Appointments             In 1 month Rockney Cid, DO [facility]

## 2024-01-06 ENCOUNTER — Ambulatory Visit: Payer: BC Managed Care – PPO | Admitting: Internal Medicine

## 2024-01-16 ENCOUNTER — Ambulatory Visit: Admitting: Internal Medicine

## 2024-01-28 ENCOUNTER — Other Ambulatory Visit: Payer: Self-pay | Admitting: Internal Medicine

## 2024-01-28 DIAGNOSIS — K219 Gastro-esophageal reflux disease without esophagitis: Secondary | ICD-10-CM

## 2024-01-29 NOTE — Telephone Encounter (Signed)
 Requested Prescriptions  Pending Prescriptions Disp Refills   pantoprazole  (PROTONIX ) 40 MG tablet [Pharmacy Med Name: PANTOPRAZOLE  SOD DR 40 MG TAB] 30 tablet 0    Sig: TAKE 1 TABLET BY MOUTH EVERY DAY     Gastroenterology: Proton Pump Inhibitors Passed - 01/29/2024  1:08 PM      Passed - Valid encounter within last 12 months    Recent Outpatient Visits           3 months ago History of gastric surgery   [facility] Rockney Cid, DO               Courtesy refill. Patient will need an office visit for additional refills.

## 2024-02-28 ENCOUNTER — Other Ambulatory Visit: Payer: Self-pay | Admitting: Internal Medicine

## 2024-02-28 DIAGNOSIS — K219 Gastro-esophageal reflux disease without esophagitis: Secondary | ICD-10-CM

## 2024-03-01 NOTE — Telephone Encounter (Signed)
 Requested Prescriptions  Pending Prescriptions Disp Refills   pantoprazole  (PROTONIX ) 40 MG tablet [Pharmacy Med Name: PANTOPRAZOLE  SOD DR 40 MG TAB] 90 tablet 0    Sig: TAKE 1 TABLET BY MOUTH EVERY DAY     Gastroenterology: Proton Pump Inhibitors Passed - 03/01/2024  3:59 PM      Passed - Valid encounter within last 12 months    Recent Outpatient Visits           5 months ago History of gastric surgery   [facility] Bernardo Fend, [place]

## 2024-04-27 ENCOUNTER — Ambulatory Visit: Admitting: Radiology

## 2024-04-27 ENCOUNTER — Ambulatory Visit
Admission: RE | Admit: 2024-04-27 | Discharge: 2024-04-27 | Disposition: A | Discharge status: Home / Self Care | Source: Ambulatory Visit | Attending: Emergency Medicine | Admitting: Emergency Medicine

## 2024-04-27 VITALS — BP 160/101 | HR 90 | Temp 98.3°F | Resp 17

## 2024-04-27 DIAGNOSIS — R030 Elevated blood-pressure reading, without diagnosis of hypertension: Secondary | ICD-10-CM | POA: Diagnosis not present

## 2024-04-27 DIAGNOSIS — S9031XA Contusion of right foot, initial encounter: Secondary | ICD-10-CM | POA: Diagnosis not present

## 2024-04-27 DIAGNOSIS — S99911A Unspecified injury of right ankle, initial encounter: Secondary | ICD-10-CM | POA: Diagnosis not present

## 2024-04-27 NOTE — ED Provider Notes (Signed)
 [facility]    CSN: [id number] Arrival date & time: 04/27/24  1424      History   Chief Complaint Chief Complaint  Patient presents with   Foot Injury    HPI Samantha Wilkerson is a 43 y.o. female.   43 year old female, Samantha Wilkerson, presents to urgent care for evaluation of foot injury after 2 liter fell on foot Monday, hurts to bear weight.   The history is provided by the patient. No language interpreter was used.    Past Medical History:  Diagnosis Date   Abnormal Pap smear 08/19/2008   performed a colposcopy   Hypertension    Gestational   Sleep apnea     Patient Active Problem List   Diagnosis Date Noted   Contusion of right foot 04/27/2024   History of gastric surgery 10/03/2023   Elevated blood pressure reading 10/03/2023   Gastroesophageal reflux disease 10/03/2023   Esophageal spasm 10/03/2023   Hidradenitis suppurativa of left axilla 10/03/2023   Onychomycosis 10/03/2023   Routine postpartum follow-up 01/08/2013   Back pain with radiation 01/08/2013   Insufficient prenatal care 10/19/2012   Obesity 10/19/2012   Obesity complicating pregnancy in third trimester 10/19/2012   Sterilization consult 10/19/2012    Past Surgical History:  Procedure Laterality Date   CESAREAN SECTION N/A 12/07/2012   Procedure: CESAREAN SECTION;  Surgeon: Winton Felt, MD;  Location: [facility];  Service: Obstetrics;  Laterality: N/A;   INNER EAR SURGERY     LAPAROSCOPIC GASTRIC SLEEVE RESECTION     TUBAL LIGATION      OB History     Gravida  5   Para  3   Term  3   Preterm      AB  2   Living  3      SAB  0   IAB  0   Ectopic  0   Multiple  0   Live Births  3            Home Medications    Prior to Admission medications   Medication Sig Start Date End Date Taking? Authorizing Provider  chlorhexidine  (HIBICLENS ) 4 % external liquid Apply topically daily as needed. 10/03/23   Bernardo Fend, DO  clindamycin  (CLINDAGEL) 1 % gel  Apply topically 2 (two) times daily. 10/03/23   Bernardo Fend, DO  pantoprazole  (PROTONIX ) 40 MG tablet TAKE 1 TABLET BY MOUTH EVERY DAY 03/01/24   Bernardo Fend, DO    Family History Family History  Problem Relation Age of Onset   Diabetes Mother    Hypertension Mother    Cancer Maternal Aunt        breast to bone    Social History Social History   Tobacco Use   Smoking status: Every Day    Types: Cigars   Smokeless tobacco: Never  Vaping Use   Vaping status: Never Used  Substance Use Topics   Alcohol use: Yes   Drug use: No     Allergies   Patient has no known allergies.   Review of Systems Review of Systems  Musculoskeletal:  Positive for gait problem.  Skin:  Positive for color change.  All other systems reviewed and are negative.    Physical Exam Triage Vital Signs ED Triage Vitals  Encounter Vitals Group     BP      Girls Systolic BP Percentile      Girls Diastolic BP Percentile      Boys Systolic  BP Percentile      Boys Diastolic BP Percentile      Pulse      Resp      Temp      Temp src      SpO2      Weight      Height      Head Circumference      Peak Flow      Pain Score      Pain Loc      Pain Education      Exclude from Growth Chart    No data found.  Updated Vital Signs BP (!) 160/101 (BP Location: Right Arm)   Pulse 90   Temp 98.3 F (36.8 C)   Resp 17   LMP 04/13/2024 (Approximate)   SpO2 99%   Visual Acuity Right Eye Distance:   Left Eye Distance:   Bilateral Distance:    Right Eye Near:   Left Eye Near:    Bilateral Near:     Physical Exam Vitals and nursing note reviewed.  Cardiovascular:     Pulses:          Dorsalis pedis pulses are 2+ on the right side.  Skin:    Findings: Bruising and signs of injury present.      Neurological:     General: No focal deficit present.     Mental Status: She is alert and oriented to person, place, and time.     GCS: GCS eye subscore is 4. GCS verbal subscore is  5. GCS motor subscore is 6.  Psychiatric:        Attention and Perception: Attention normal.        Mood and Affect: Mood normal.        Speech: Speech normal.        Behavior: Behavior normal.      UC Treatments / Results  Labs (all labs ordered are listed, but only abnormal results are displayed) Labs Reviewed - No data to display  EKG   Radiology DG Ankle Complete Right Result Date: 04/27/2024 EXAM: 3 or more VIEW(S) XRAY OF THE ANKLE 04/27/2024 02:45:03 PM CLINICAL HISTORY: Injury. COMPARISON: None available. FINDINGS: BONES AND JOINTS: No acute fracture. No focal osseous lesion. No joint dislocation. SOFT TISSUES: Soft tissue swelling at the ankle. IMPRESSION: 1. No acute osseous abnormality. 2. Soft tissue swelling at the ankle. Electronically signed by: Waddell Calk MD 04/27/2024 03:23 PM EDT RP Workstation: [id number]    Procedures Procedures (including critical care time)  Medications Ordered in UC Medications - No data to display  Initial Impression / Assessment and Plan / UC Course  I have reviewed the triage vital signs and the nursing notes.  Pertinent labs & imaging results that were available during my care of the patient were reviewed by me and considered in my medical decision making (see chart for details).    Discussed exam findings and plan of care with patient, Ace wrap applied to right foot , crutches given , no apparent fracture via wet read by this provider , will check my chart for results , strict go to ER precautions given.   Patient verbalized understanding to this provider.  Ddx: Contusion of right foot, elevated blood pressure reading Final Clinical Impressions(s) / UC Diagnoses   Final diagnoses:  Contusion of right foot, initial encounter  Elevated blood pressure reading     Discharge Instructions      Wet read of your xray appears negative.  Check my chart for official radiology read.  Rest,ice,elevate wear ace wrap, use crutches,  may take tylenol  as label directed Follow up with Orthopedics in 1 week if pain persists     ED Prescriptions   None    PDMP not reviewed this encounter.   Aminta Loose, NP 04/27/24 2021

## 2024-04-27 NOTE — Discharge Instructions (Addendum)
 Wet read of your xray appears negative.  Check my chart for official radiology read.  Rest,ice,elevate wear ace wrap, use crutches, may take tylenol  as label directed Follow up with Orthopedics in 1 week if pain persists

## 2024-04-27 NOTE — ED Triage Notes (Signed)
 Pt presents with pain in right foot after a 2 liter landed on her foot Monday. States pain has not gotten better and is hard to walk.

## 2024-04-28 ENCOUNTER — Ambulatory Visit
Admission: RE | Admit: 2024-04-28 | Discharge: 2024-04-28 | Disposition: A | Discharge status: Home / Self Care | Source: Ambulatory Visit | Attending: Internal Medicine | Admitting: Internal Medicine

## 2024-04-28 ENCOUNTER — Encounter

## 2024-04-28 DIAGNOSIS — Z1231 Encounter for screening mammogram for malignant neoplasm of breast: Secondary | ICD-10-CM | POA: Insufficient documentation

## 2024-04-30 DIAGNOSIS — S9031XA Contusion of right foot, initial encounter: Secondary | ICD-10-CM | POA: Diagnosis not present

## 2024-05-03 ENCOUNTER — Ambulatory Visit: Payer: Self-pay | Admitting: Internal Medicine

## 2024-05-11 ENCOUNTER — Ambulatory Visit: Admitting: Internal Medicine

## 2024-05-12 DIAGNOSIS — S9031XD Contusion of right foot, subsequent encounter: Secondary | ICD-10-CM | POA: Diagnosis not present

## 2024-05-20 ENCOUNTER — Other Ambulatory Visit: Payer: Self-pay | Admitting: Internal Medicine

## 2024-05-20 DIAGNOSIS — K219 Gastro-esophageal reflux disease without esophagitis: Secondary | ICD-10-CM

## 2024-05-21 NOTE — Telephone Encounter (Signed)
 Requested Prescriptions  Pending Prescriptions Disp Refills   pantoprazole  (PROTONIX ) 40 MG tablet [Pharmacy Med Name: PANTOPRAZOLE  SOD DR 40 MG TAB] 90 tablet 1    Sig: TAKE 1 TABLET BY MOUTH EVERY DAY     Gastroenterology: Proton Pump Inhibitors Passed - 05/21/2024  1:07 PM      Passed - Valid encounter within last 12 months    Recent Outpatient Visits           7 months ago History of gastric surgery   [facility] Bernardo Fend, [place]

## 2024-05-27 DIAGNOSIS — J301 Allergic rhinitis due to pollen: Secondary | ICD-10-CM | POA: Diagnosis not present
